# Patient Record
Sex: Female | Born: 1975
Health system: Southern US, Community
[De-identification: ages and names within clinical notes are randomized; demographics above are authoritative.]

## PROBLEM LIST (undated history)

## (undated) DIAGNOSIS — K219 Gastro-esophageal reflux disease without esophagitis: Secondary | ICD-10-CM

## (undated) DIAGNOSIS — T7840XA Allergy, unspecified, initial encounter: Secondary | ICD-10-CM

## (undated) DIAGNOSIS — F419 Anxiety disorder, unspecified: Secondary | ICD-10-CM

## (undated) HISTORY — DX: Gastro-esophageal reflux disease without esophagitis: K21.9

## (undated) HISTORY — DX: Allergy, unspecified, initial encounter: T78.40XA

## (undated) HISTORY — DX: Anxiety disorder, unspecified: F41.9

---

## 1998-05-08 ENCOUNTER — Emergency Department (HOSPITAL_COMMUNITY): Admission: EM | Admit: 1998-05-08 | Discharge: 1998-05-08 | Payer: Self-pay | Admitting: Endocrinology

## 1999-09-24 ENCOUNTER — Inpatient Hospital Stay (HOSPITAL_COMMUNITY): Admission: AD | Admit: 1999-09-24 | Discharge: 1999-09-24 | Payer: Self-pay | Admitting: *Deleted

## 1999-12-28 ENCOUNTER — Other Ambulatory Visit: Admission: RE | Admit: 1999-12-28 | Discharge: 1999-12-28 | Payer: Self-pay | Admitting: Internal Medicine

## 2001-01-09 ENCOUNTER — Emergency Department (HOSPITAL_COMMUNITY): Admission: EM | Admit: 2001-01-09 | Discharge: 2001-01-09 | Payer: Self-pay | Admitting: Emergency Medicine

## 2002-06-19 ENCOUNTER — Emergency Department (HOSPITAL_COMMUNITY): Admission: EM | Admit: 2002-06-19 | Discharge: 2002-06-19 | Payer: Self-pay | Admitting: Emergency Medicine

## 2002-06-20 ENCOUNTER — Encounter: Payer: Self-pay | Admitting: Emergency Medicine

## 2004-07-01 ENCOUNTER — Emergency Department (HOSPITAL_COMMUNITY): Admission: EM | Admit: 2004-07-01 | Discharge: 2004-07-01 | Payer: Self-pay | Admitting: Emergency Medicine

## 2005-01-30 ENCOUNTER — Emergency Department: Payer: Self-pay | Admitting: Unknown Physician Specialty

## 2005-01-31 ENCOUNTER — Emergency Department (HOSPITAL_COMMUNITY): Admission: EM | Admit: 2005-01-31 | Discharge: 2005-01-31 | Payer: Self-pay | Admitting: Emergency Medicine

## 2005-02-01 ENCOUNTER — Emergency Department: Payer: Self-pay | Admitting: Unknown Physician Specialty

## 2009-01-18 ENCOUNTER — Emergency Department (HOSPITAL_COMMUNITY): Admission: EM | Admit: 2009-01-18 | Discharge: 2009-01-19 | Payer: Self-pay | Admitting: Emergency Medicine

## 2011-02-25 ENCOUNTER — Emergency Department (HOSPITAL_COMMUNITY)
Admission: EM | Admit: 2011-02-25 | Discharge: 2011-02-26 | Disposition: A | Discharge status: Home / Self Care | Payer: BC Managed Care – PPO | Attending: Emergency Medicine | Admitting: Emergency Medicine

## 2011-02-25 DIAGNOSIS — K2990 Gastroduodenitis, unspecified, without bleeding: Secondary | ICD-10-CM | POA: Insufficient documentation

## 2011-02-25 DIAGNOSIS — IMO0001 Reserved for inherently not codable concepts without codable children: Secondary | ICD-10-CM | POA: Insufficient documentation

## 2011-02-25 DIAGNOSIS — R1013 Epigastric pain: Secondary | ICD-10-CM | POA: Insufficient documentation

## 2011-02-25 DIAGNOSIS — K2970 Gastritis, unspecified, without bleeding: Secondary | ICD-10-CM | POA: Insufficient documentation

## 2011-02-25 DIAGNOSIS — E876 Hypokalemia: Secondary | ICD-10-CM | POA: Insufficient documentation

## 2011-02-25 LAB — DIFFERENTIAL
Basophils Absolute: 0 10*3/uL (ref 0.0–0.1)
Basophils Relative: 0 % (ref 0–1)
Eosinophils Relative: 1 % (ref 0–5)
Lymphocytes Relative: 30 % (ref 12–46)
Lymphs Abs: 2.4 10*3/uL (ref 0.7–4.0)
Monocytes Absolute: 0.8 10*3/uL (ref 0.1–1.0)
Monocytes Relative: 9 % (ref 3–12)
Neutro Abs: 4.8 10*3/uL (ref 1.7–7.7)
Neutrophils Relative %: 60 % (ref 43–77)

## 2011-02-25 LAB — COMPREHENSIVE METABOLIC PANEL
ALT: 19 U/L (ref 0–35)
CO2: 25 mEq/L (ref 19–32)
Calcium: 9.2 mg/dL (ref 8.4–10.5)
Creatinine, Ser: 0.72 mg/dL (ref 0.4–1.2)
GFR calc non Af Amer: >60 mL/min (ref >60)
Glucose, Bld: 106 mg/dL — ABNORMAL HIGH (ref 70–99)
Sodium: 134 mEq/L — ABNORMAL LOW (ref 135–145)
Total Protein: 6.8 g/dL (ref 6.0–8.3)

## 2011-02-25 LAB — URINALYSIS, ROUTINE W REFLEX MICROSCOPIC
Leukocytes, UA: NEGATIVE
Nitrite: NEGATIVE
Specific Gravity, Urine: 1.03 (ref 1.005–1.030)
Urobilinogen, UA: 0.2 mg/dL (ref 0.0–1.0)
pH: 7.5 (ref 5.0–8.0)

## 2011-02-25 LAB — CBC
HCT: 44.6 % (ref 36.0–46.0)
MCH: 32.6 pg (ref 26.0–34.0)
MCHC: 34.1 g/dL (ref 30.0–36.0)
MCV: 95.7 fL (ref 78.0–100.0)
RBC: 4.66 MIL/uL (ref 3.87–5.11)
RDW: 13.6 % (ref 11.5–15.5)
WBC: 8 10*3/uL (ref 4.0–10.5)

## 2011-02-25 LAB — URINE MICROSCOPIC-ADD ON

## 2011-02-25 LAB — LIPASE, BLOOD: Lipase: 16 U/L (ref 11–59)

## 2012-09-07 LAB — HM PAP SMEAR

## 2013-06-30 ENCOUNTER — Ambulatory Visit (INDEPENDENT_AMBULATORY_CARE_PROVIDER_SITE_OTHER): Payer: BC Managed Care – PPO | Admitting: Physician Assistant

## 2013-06-30 VITALS — BP 118/74 | HR 78 | Temp 98.0°F | Resp 17 | Ht 65.0 in | Wt 192.0 lb

## 2013-06-30 DIAGNOSIS — J329 Chronic sinusitis, unspecified: Secondary | ICD-10-CM

## 2013-06-30 DIAGNOSIS — M545 Low back pain, unspecified: Secondary | ICD-10-CM

## 2013-06-30 DIAGNOSIS — R0982 Postnasal drip: Secondary | ICD-10-CM

## 2013-06-30 DIAGNOSIS — J029 Acute pharyngitis, unspecified: Secondary | ICD-10-CM

## 2013-06-30 DIAGNOSIS — R05 Cough: Secondary | ICD-10-CM

## 2013-06-30 DIAGNOSIS — R059 Cough, unspecified: Secondary | ICD-10-CM

## 2013-06-30 LAB — POCT RAPID STREP A (OFFICE): Rapid Strep A Screen: NEGATIVE

## 2013-06-30 MED ORDER — HYDROCODONE-HOMATROPINE 5-1.5 MG/5ML PO SYRP: 5.0000 mL | ORAL_SOLUTION | Freq: Three times a day (TID) | ORAL | Status: DC | PRN

## 2013-06-30 MED ORDER — CYCLOBENZAPRINE HCL 10 MG PO TABS: 10.0000 mg | ORAL_TABLET | Freq: Three times a day (TID) | ORAL | Status: DC | PRN

## 2013-06-30 MED ORDER — IPRATROPIUM BROMIDE 0.03 % NA SOLN: 2.0000 | Freq: Two times a day (BID) | NASAL | Status: DC

## 2013-06-30 MED ORDER — CETIRIZINE HCL 10 MG PO TABS: 10.0000 mg | ORAL_TABLET | Freq: Every day | ORAL | Status: DC

## 2013-06-30 MED ORDER — FIRST-DUKES MOUTHWASH MT SUSP: 10.0000 mL | OROMUCOSAL | Status: DC | PRN

## 2013-06-30 NOTE — Patient Instructions (Addendum)
Begin using the Atrovent (ipratropium) nasal spray 2-3 times per day to help with nasal congestion and postnasal drainage.  Zyrtec (cetirizine) once daily - this will help with congestion and drainage.  Ibuprofen 600mg  every 8 hours with food to help with throat pain and back pain.  Magic Mouthwash as frequently as every 2 hours if needed for sore throat.  Hycodan cough syrup at bedtime to help with cough - will likely make you sleepy  Flexeril (cyclobenzaprine) every 8 hours as needed for muscle spasm/back pain - may also make you sleepy  Stay hydrated - water is best!  Continue salt water gargles  STOP smoking!!  I will let you know when your throat culture is back and if we need to do anything differently.   Viral Pharyngitis Viral pharyngitis is a viral infection that produces redness, pain, and swelling (inflammation) of the throat. It can spread from person to person (contagious). CAUSES Viral pharyngitis is caused by inhaling a large amount of certain germs called viruses. Many different viruses cause viral pharyngitis. SYMPTOMS Symptoms of viral pharyngitis include:  Sore throat.  Tiredness.  Stuffy nose.  Low-grade fever.  Congestion.  Cough. TREATMENT Treatment includes rest, drinking plenty of fluids, and the use of over-the-counter medication (approved by your caregiver). HOME CARE INSTRUCTIONS   Drink enough fluids to keep your urine clear or pale yellow.  Eat soft, cold foods such as ice cream, frozen ice pops, or gelatin dessert.  Gargle with warm salt water (1 tsp salt per 1 qt of water).  If over age 1, throat lozenges may be used safely.  Only take over-the-counter or prescription medicines for pain, discomfort, or fever as directed by your caregiver. Do not take aspirin. To help prevent spreading viral pharyngitis to others, avoid:  Mouth-to-mouth contact with others.  Sharing utensils for eating and drinking.  Coughing around others. SEEK  MEDICAL CARE IF:   You are better in a few days, then become worse.  You have a fever or pain not helped by pain medicines.  There are any other changes that concern you. Document Released: 07/18/2005 Document Revised: 12/31/2011 Document Reviewed: 12/14/2010 [facility] Patient Information 2014 [place], [place].

## 2013-06-30 NOTE — Progress Notes (Signed)
Subjective:    Patient ID: TAILEY TOP, female    DOB: 19-Dec-1975, 36 y.o.   MRN: [id number]  HPI   Ms. Oliveri is a very pleasant 37 yr old female here with concern for illness. Reports worsening sore throat over the last couple days.  Has noted white stuff on tonsils.  Has previously been told she may need her tonsils out.  Endorses lots of post-nasal drainage and cough.  Minimal nasal drainage.  No fever or chills.  Cough is keeping her awake at night.  Abd muscles sore from coughing.  Pt does smoke two black and milds per day.  No known sick contacts.  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    Also complains of LBP after she slipped and fell a couple days ago.  Pain is bilateral.  No numbness, weakness, loss of bowel or bladder control. Has not taken anything for pain.  Has done ice and heat. Main complaint today is throat.    Did take a course of amox from dentist recently.  Finished last dose 1 wk ago   Review of Systems  Constitutional: Negative for fever and chills.  HENT: Positive for congestion, sore throat and postnasal drip. Negative for ear pain.   Respiratory: Positive for cough. Negative for shortness of breath and wheezing.   Cardiovascular: Negative.   Gastrointestinal: Negative.   Musculoskeletal: Negative.   Skin: Negative.   Neurological: Negative.        Objective:   Physical Exam  Vitals reviewed. Constitutional: She is oriented to person, place, and time. She appears well-developed and well-nourished. No distress.  HENT:  Head: Normocephalic and atraumatic.  Right Ear: Tympanic membrane and ear canal normal.  Left Ear: Tympanic membrane and ear canal normal.  Mouth/Throat: Uvula is midline and mucous membranes are normal. Oropharyngeal exudate, posterior oropharyngeal edema and posterior oropharyngeal erythema present. No tonsillar abscesses.  Eyes: Conjunctivae are normal. No scleral icterus.  Neck: Neck supple.  Cardiovascular: Normal rate, regular  rhythm and normal heart sounds.   Pulmonary/Chest: Effort normal and breath sounds normal. She has no wheezes. She has no rales.  Musculoskeletal:       Lumbar back: She exhibits tenderness and spasm. She exhibits normal range of motion and no bony tenderness.       Back:  Lymphadenopathy:    She has cervical adenopathy.  Neurological: She is alert and oriented to person, place, and time.  Skin: Skin is warm and dry.  Psychiatric: She has a normal mood and affect. Her behavior is normal.     Results for orders placed in visit on 06/30/13  POCT RAPID STREP A (OFFICE)      Result Value Range   Rapid Strep A Screen Negative  Negative        Assessment & Plan:  Acute pharyngitis - Plan: POCT rapid strep A, Culture, Group A Strep, Diphenhyd-Hydrocort-Nystatin (FIRST-DUKES MOUTHWASH) SUSP, Ambulatory referral to ENT, CANCELED: Wound culture  Low back pain - Plan: cyclobenzaprine (FLEXERIL) 10 MG tablet  Post-nasal drainage - Plan: ipratropium (ATROVENT) 0.03 % nasal spray, cetirizine (ZYRTEC) 10 MG tablet  Cough - Plan: HYDROcodone-homatropine (HYCODAN) 5-1.5 MG/5ML syrup    Ms. Crocker is a very pleasant 37 yr old female here with pharyngitis - likely viral, associated with URI symptoms.  Rapid strep is negative today.  Cx pending.  Will treat symptomatically for now - ibuprofen, Duke's, Atrovent nasal, Zyrtec.  Hycodan for cough at night.  Push fluids.  Rest.  Work  note provided.  Ibuprofen 600mg  q8h with food for throat pain and low back pain.  Flexeril q8h prn back pain/spasm.   RTC if any symptoms worsening or not improving.

## 2013-08-26 ENCOUNTER — Ambulatory Visit: Payer: BC Managed Care – PPO | Admitting: Family Medicine

## 2013-09-04 ENCOUNTER — Telehealth: Payer: Self-pay

## 2013-09-04 NOTE — Telephone Encounter (Signed)
Medication and allergies: reviewed and updated  90 day supply/mail order: na Local pharmacy: [facility] and [address]   Immunizations due:  Declines flu vaccine  A/P:   Updated  FH, PSH and medical hx  To Discuss with Provider: Not at this time

## 2013-09-07 ENCOUNTER — Ambulatory Visit (INDEPENDENT_AMBULATORY_CARE_PROVIDER_SITE_OTHER): Payer: BC Managed Care – PPO | Admitting: Family Medicine

## 2013-09-07 ENCOUNTER — Encounter: Payer: Self-pay | Admitting: Family Medicine

## 2013-09-07 VITALS — BP 108/74 | HR 94 | Temp 98.8°F | Ht 66.0 in | Wt 197.8 lb

## 2013-09-07 DIAGNOSIS — R0609 Other forms of dyspnea: Secondary | ICD-10-CM

## 2013-09-07 DIAGNOSIS — R0683 Snoring: Secondary | ICD-10-CM

## 2013-09-07 DIAGNOSIS — M549 Dorsalgia, unspecified: Secondary | ICD-10-CM

## 2013-09-07 DIAGNOSIS — E669 Obesity, unspecified: Secondary | ICD-10-CM

## 2013-09-07 DIAGNOSIS — G4700 Insomnia, unspecified: Secondary | ICD-10-CM

## 2013-09-07 DIAGNOSIS — Z0000 Encounter for general adult medical examination without abnormal findings: Secondary | ICD-10-CM

## 2013-09-07 DIAGNOSIS — R0989 Other specified symptoms and signs involving the circulatory and respiratory systems: Secondary | ICD-10-CM

## 2013-09-07 NOTE — Progress Notes (Signed)
Pre visit review using our clinic review tool, if applicable. No additional management support is needed unless otherwise documented below in the visit note. 

## 2013-09-07 NOTE — Progress Notes (Signed)
Subjective:     Yvette Brennan is a 37 y.o. female and is here for a comprehensive physical exam. The patient reports problems - back pain s/p fall in August --fell down wood steps.  History   Social History  . Marital Status: Single    Spouse Name: N/A    Number of Children: N/A  . Years of Education: N/A   Occupational History  . Not on file.   Social History Main Topics  . Smoking status: Current Every Day Smoker -- 0.00 packs/day for 10 years    Types: Cigars  . Smokeless tobacco: Not on file  . Alcohol Use: No  . Drug Use: No  . Sexual Activity: No   Other Topics Concern  . Not on file   Social History Narrative  . No narrative on file   Health Maintenance  Topic Date Due  . Influenza Vaccine  09/07/2014  . Pap Smear  09/08/2015  . Tetanus/tdap  10/22/2018    The following portions of the patient's history were reviewed and updated as appropriate:  She  has no past medical history on file. She  does not have a problem list on file. She  has no past surgical history on file. Her family history includes Diabetes in her father and paternal grandmother; Heart disease in her maternal grandfather, maternal grandmother, and paternal grandmother. She  reports that she has been smoking Cigars.  She does not have any smokeless tobacco history on file. She reports that she does not drink alcohol or use illicit drugs. She has a current medication list which includes the following prescription(s): cyclobenzaprine. Current Outpatient Prescriptions on File Prior to Visit  Medication Sig Dispense Refill  . cyclobenzaprine (FLEXERIL) 10 MG tablet Take 1 tablet (10 mg total) by mouth 3 (three) times daily as needed for muscle spasms.  30 tablet  0   No current facility-administered medications on file prior to visit.   She has No Known Allergies..  Review of Systems Review of Systems  Constitutional: Negative for activity change, appetite change and fatigue.  HENT:  Negative for hearing loss, congestion, tinnitus and ear discharge.  dentist q63m Eyes: Negative for visual disturbance (see optho q1y -- vision corrected to 20/20 with glasses).  Respiratory: Negative for cough, chest tightness and shortness of breath.   Cardiovascular: Negative for chest pain, palpitations and leg swelling.  Gastrointestinal: Negative for abdominal pain, diarrhea, constipation and abdominal distention.  Genitourinary: Negative for urgency, frequency, decreased urine volume and difficulty urinating.  Musculoskeletal: + back pain Skin: Negative for color change, pallor and rash.  Neurological: Negative for dizziness, light-headedness, numbness and headaches.  Hematological: Negative for adenopathy. Does not bruise/bleed easily.  Psychiatric/Behavioral: Negative for suicidal ideas, confusion, sleep disturbance, self-injury, dysphoric mood, decreased concentration and agitation.       Objective:    BP 108/74  Pulse 94  Temp(Src) 98.8 F (37.1 C) (Oral)  Ht 5\' 6"  (1.676 m)  Wt 197 lb 12.8 oz (89.721 kg)  BMI 31.94 kg/m2  SpO2 97%  LMP 08/26/2013 General appearance: alert, cooperative, appears stated age and no distress Head: Normocephalic, without obvious abnormality, atraumatic Eyes: conjunctivae/corneas clear. PERRL, EOM's intact. Fundi benign. Ears: normal TM's and external ear canals both ears Nose: Nares normal. Septum midline. Mucosa normal. No drainage or sinus tenderness. Throat: lips, mucosa, and tongue normal; teeth and gums normal Neck: no adenopathy, no carotid bruit, no JVD, supple, symmetrical, trachea midline and thyroid not enlarged, symmetric, no tenderness/mass/nodules Back: symmetric,  no curvature. ROM normal. No CVA tenderness. Lungs: clear to auscultation bilaterally Breasts: normal appearance, no masses or tenderness Heart: regular rate and rhythm, S1, S2 normal, no murmur, click, rub or gallop Abdomen: soft, non-tender; bowel sounds normal; no  masses,  no organomegaly Pelvic: deferred Extremities: extremities normal, atraumatic, no cyanosis or edema Pulses: 2+ and symmetric Skin: Skin color, texture, turgor normal. No rashes or lesions Lymph nodes: Cervical, supraclavicular, and axillary nodes normal. Neurologic: Alert and oriented X 3, normal strength and tone. Normal symmetric reflexes. Normal coordination and gait Psych--no depression, no anxiety      Assessment:    Healthy female exam.      Plan:    ghm utd Check labs See After Visit Summary for Counseling Recommendations

## 2013-09-07 NOTE — Patient Instructions (Signed)
Preventive Care for Adults, Female A healthy lifestyle and preventive care can promote health and wellness. Preventive health guidelines for women include the following key practices.  A routine yearly physical is a good way to check with your caregiver about your health and preventive screening. It is a chance to share any concerns and updates on your health, and to receive a thorough exam.  Visit your dentist for a routine exam and preventive care every 6 months. Brush your teeth twice a day and floss once a day. Good oral hygiene prevents tooth decay and gum disease.  The frequency of eye exams is based on your age, health, family medical history, use of contact lenses, and other factors. Follow your caregiver's recommendations for frequency of eye exams.  Eat a healthy diet. Foods like vegetables, fruits, whole grains, low-fat dairy products, and lean protein foods contain the nutrients you need without too many calories. Decrease your intake of foods high in solid fats, added sugars, and salt. Eat the right amount of calories for you.Get information about a proper diet from your caregiver, if necessary.  Regular physical exercise is one of the most important things you can do for your health. Most adults should get at least 150 minutes of moderate-intensity exercise (any activity that increases your heart rate and causes you to sweat) each week. In addition, most adults need muscle-strengthening exercises on 2 or more days a week.  Maintain a healthy weight. The body mass index (BMI) is a screening tool to identify possible weight problems. It provides an estimate of body fat based on height and weight. Your caregiver can help determine your BMI, and can help you achieve or maintain a healthy weight.For adults 20 years and older:  A BMI below 18.5 is considered underweight.  A BMI of 18.5 to 24.9 is normal.  A BMI of 25 to 29.9 is considered overweight.  A BMI of 30 and above is  considered obese.  Maintain normal blood lipids and cholesterol levels by exercising and minimizing your intake of saturated fat. Eat a balanced diet with plenty of fruit and vegetables. Blood tests for lipids and cholesterol should begin at age 20 and be repeated every 5 years. If your lipid or cholesterol levels are high, you are over 50, or you are at high risk for heart disease, you may need your cholesterol levels checked more frequently.Ongoing high lipid and cholesterol levels should be treated with medicines if diet and exercise are not effective.  If you smoke, find out from your caregiver how to quit. If you do not use tobacco, do not start.  Lung cancer screening is recommended for adults aged 55 80 years who are at high risk for developing lung cancer because of a history of smoking. Yearly low-dose computed tomography (CT) is recommended for people who have at least a 30-pack-year history of smoking and are a current smoker or have quit within the past 15 years. A pack year of smoking is smoking an average of 1 pack of cigarettes a day for 1 year (for example: 1 pack a day for 30 years or 2 packs a day for 15 years). Yearly screening should continue until the smoker has stopped smoking for at least 15 years. Yearly screening should also be stopped for people who develop a health problem that would prevent them from having lung cancer treatment.  If you are pregnant, do not drink alcohol. If you are breastfeeding, be very cautious about drinking alcohol. If you are   not pregnant and choose to drink alcohol, do not exceed 1 drink per day. One drink is considered to be 12 ounces (355 mL) of beer, 5 ounces (148 mL) of wine, or 1.5 ounces (44 mL) of liquor.  Avoid use of street drugs. Do not share needles with anyone. Ask for help if you need support or instructions about stopping the use of drugs.  High blood pressure causes heart disease and increases the risk of stroke. Your blood pressure  should be checked at least every 1 to 2 years. Ongoing high blood pressure should be treated with medicines if weight loss and exercise are not effective.  If you are 55 to 39 years old, ask your caregiver if you should take aspirin to prevent strokes.  Diabetes screening involves taking a blood sample to check your fasting blood sugar level. This should be done once every 3 years, after age 45, if you are within normal weight and without risk factors for diabetes. Testing should be considered at a younger age or be carried out more frequently if you are overweight and have at least 1 risk factor for diabetes.  Breast cancer screening is essential preventive care for women. You should practice "breast self-awareness." This means understanding the normal appearance and feel of your breasts and may include breast self-examination. Any changes detected, no matter how small, should be reported to a caregiver. Women in their 20s and 30s should have a clinical breast exam (CBE) by a caregiver as part of a regular health exam every 1 to 3 years. After age 40, women should have a CBE every year. Starting at age 40, women should consider having a mammography (breast X-ray test) every year. Women who have a family history of breast cancer should talk to their caregiver about genetic screening. Women at a high risk of breast cancer should talk to their caregivers about having magnetic resonance imaging (MRI) and a mammography every year.  Breast cancer gene (BRCA)-related cancer risk assessment is recommended for women who have family members with BRCA-related cancers. BRCA-related cancers include breast, ovarian, tubal, and peritoneal cancers. Having family members with these cancers may be associated with an increased risk for harmful changes (mutations) in the breast cancer genes BRCA1 and BRCA2. Results of the assessment will determine the need for genetic counseling and BRCA1 and BRCA2 testing.  The Pap test is  a screening test for cervical cancer. A Pap test can show cell changes on the cervix that might become cervical cancer if left untreated. A Pap test is a procedure in which cells are obtained and examined from the lower end of the uterus (cervix).  Women should have a Pap test starting at age 21.  Between ages 21 and 29, Pap tests should be repeated every 2 years.  Beginning at age 30, you should have a Pap test every 3 years as long as the past 3 Pap tests have been normal.  Some women have medical problems that increase the chance of getting cervical cancer. Talk to your caregiver about these problems. It is especially important to talk to your caregiver if a new problem develops soon after your last Pap test. In these cases, your caregiver may recommend more frequent screening and Pap tests.  The above recommendations are the same for women who have or have not gotten the vaccine for human papillomavirus (HPV).  If you had a hysterectomy for a problem that was not cancer or a condition that could lead to cancer, then   you no longer need Pap tests. Even if you no longer need a Pap test, a regular exam is a good idea to make sure no other problems are starting.  If you are between ages 65 and 70, and you have had normal Pap tests going back 10 years, you no longer need Pap tests. Even if you no longer need a Pap test, a regular exam is a good idea to make sure no other problems are starting.  If you have had past treatment for cervical cancer or a condition that could lead to cancer, you need Pap tests and screening for cancer for at least 20 years after your treatment.  If Pap tests have been discontinued, risk factors (such as a new sexual partner) need to be reassessed to determine if screening should be resumed.  The HPV test is an additional test that may be used for cervical cancer screening. The HPV test looks for the virus that can cause the cell changes on the cervix. The cells collected  during the Pap test can be tested for HPV. The HPV test could be used to screen women aged 30 years and older, and should be used in women of any age who have unclear Pap test results. After the age of 30, women should have HPV testing at the same frequency as a Pap test.  Colorectal cancer can be detected and often prevented. Most routine colorectal cancer screening begins at the age of 50 and continues through age 75. However, your caregiver may recommend screening at an earlier age if you have risk factors for colon cancer. On a yearly basis, your caregiver may provide home test kits to check for hidden blood in the stool. Use of a small camera at the end of a tube, to directly examine the colon (sigmoidoscopy or colonoscopy), can detect the earliest forms of colorectal cancer. Talk to your caregiver about this at age 50, when routine screening begins. Direct examination of the colon should be repeated every 5 to 10 years through age 75, unless early forms of pre-cancerous polyps or small growths are found.  Hepatitis C blood testing is recommended for all people born from 1945 through 1965 and any individual with known risks for hepatitis C.  Practice safe sex. Use condoms and avoid high-risk sexual practices to reduce the spread of sexually transmitted infections (STIs). STIs include gonorrhea, chlamydia, syphilis, trichomonas, herpes, HPV, and human immunodeficiency virus (HIV). Herpes, HIV, and HPV are viral illnesses that have no cure. They can result in disability, cancer, and death. Sexually active women aged 25 and younger should be checked for chlamydia. Older women with new or multiple partners should also be tested for chlamydia. Testing for other STIs is recommended if you are sexually active and at increased risk.  Osteoporosis is a disease in which the bones lose minerals and strength with aging. This can result in serious bone fractures. The risk of osteoporosis can be identified using a  bone density scan. Women ages 65 and over and women at risk for fractures or osteoporosis should discuss screening with their caregivers. Ask your caregiver whether you should take a calcium supplement or vitamin D to reduce the rate of osteoporosis.  Menopause can be associated with physical symptoms and risks. Hormone replacement therapy is available to decrease symptoms and risks. You should talk to your caregiver about whether hormone replacement therapy is right for you.  Use sunscreen. Apply sunscreen liberally and repeatedly throughout the day. You should seek shade   when your shadow is shorter than you. Protect yourself by wearing long sleeves, pants, a wide-brimmed hat, and sunglasses year round, whenever you are outdoors.  Once a month, do a whole body skin exam, using a mirror to look at the skin on your back. Notify your caregiver of new moles, moles that have irregular borders, moles that are larger than a pencil eraser, or moles that have changed in shape or color.  Stay current with required immunizations.  Influenza vaccine. All adults should be immunized every year.  Tetanus, diphtheria, and acellular pertussis (Td, Tdap) vaccine. Pregnant women should receive 1 dose of Tdap vaccine during each pregnancy. The dose should be obtained regardless of the length of time since the last dose. Immunization is preferred during the 27th to 36th week of gestation. An adult who has not previously received Tdap or who does not know her vaccine status should receive 1 dose of Tdap. This initial dose should be followed by tetanus and diphtheria toxoids (Td) booster doses every 10 years. Adults with an unknown or incomplete history of completing a 3-dose immunization series with Td-containing vaccines should begin or complete a primary immunization series including a Tdap dose. Adults should receive a Td booster every 10 years.  Varicella vaccine. An adult without evidence of immunity to varicella  should receive 2 doses or a second dose if she has previously received 1 dose. Pregnant females who do not have evidence of immunity should receive the first dose after pregnancy. This first dose should be obtained before leaving the health care facility. The second dose should be obtained 4 8 weeks after the first dose.  Human papillomavirus (HPV) vaccine. Females aged 13 26 years who have not received the vaccine previously should obtain the 3-dose series. The vaccine is not recommended for use in pregnant females. However, pregnancy testing is not needed before receiving a dose. If a female is found to be pregnant after receiving a dose, no treatment is needed. In that case, the remaining doses should be delayed until after the pregnancy. Immunization is recommended for any person with an immunocompromised condition through the age of 26 years if she did not get any or all doses earlier. During the 3-dose series, the second dose should be obtained 4 8 weeks after the first dose. The third dose should be obtained 24 weeks after the first dose and 16 weeks after the second dose.  Zoster vaccine. One dose is recommended for adults aged 60 years or older unless certain conditions are present.  Measles, mumps, and rubella (MMR) vaccine. Adults born before 1957 generally are considered immune to measles and mumps. Adults born in 1957 or later should have 1 or more doses of MMR vaccine unless there is a contraindication to the vaccine or there is laboratory evidence of immunity to each of the three diseases. A routine second dose of MMR vaccine should be obtained at least 28 days after the first dose for students attending postsecondary schools, health care workers, or international travelers. People who received inactivated measles vaccine or an unknown type of measles vaccine during 1963 1967 should receive 2 doses of MMR vaccine. People who received inactivated mumps vaccine or an unknown type of mumps vaccine  before 1979 and are at high risk for mumps infection should consider immunization with 2 doses of MMR vaccine. For females of childbearing age, rubella immunity should be determined. If there is no evidence of immunity, females who are not pregnant should be vaccinated. If there   is no evidence of immunity, females who are pregnant should delay immunization until after pregnancy. Unvaccinated health care workers born before 1957 who lack laboratory evidence of measles, mumps, or rubella immunity or laboratory confirmation of disease should consider measles and mumps immunization with 2 doses of MMR vaccine or rubella immunization with 1 dose of MMR vaccine.  Pneumococcal 13-valent conjugate (PCV13) vaccine. When indicated, a person who is uncertain of her immunization history and has no record of immunization should receive the PCV13 vaccine. An adult aged 19 years or older who has certain medical conditions and has not been previously immunized should receive 1 dose of PCV13 vaccine. This PCV13 should be followed with a dose of pneumococcal polysaccharide (PPSV23) vaccine. The PPSV23 vaccine dose should be obtained at least 8 weeks after the dose of PCV13 vaccine. An adult aged 19 years or older who has certain medical conditions and previously received 1 or more doses of PPSV23 vaccine should receive 1 dose of PCV13. The PCV13 vaccine dose should be obtained 1 or more years after the last PPSV23 vaccine dose.  Pneumococcal polysaccharide (PPSV23) vaccine. When PCV13 is also indicated, PCV13 should be obtained first. All adults aged 65 years and older should be immunized. An adult younger than age 65 years who has certain medical conditions should be immunized. Any person who resides in a nursing home or long-term care facility should be immunized. An adult smoker should be immunized. People with an immunocompromised condition and certain other conditions should receive both PCV13 and PPSV23 vaccines. People  with human immunodeficiency virus (HIV) infection should be immunized as soon as possible after diagnosis. Immunization during chemotherapy or radiation therapy should be avoided. Routine use of PPSV23 vaccine is not recommended for American Indians, [place] Natives, or people younger than 65 years unless there are medical conditions that require PPSV23 vaccine. When indicated, people who have unknown immunization and have no record of immunization should receive PPSV23 vaccine. One-time revaccination 5 years after the first dose of PPSV23 is recommended for people aged 19 64 years who have chronic kidney failure, nephrotic syndrome, asplenia, or immunocompromised conditions. People who received 1 2 doses of PPSV23 before age 65 years should receive another dose of PPSV23 vaccine at age 65 years or later if at least 5 years have passed since the previous dose. Doses of PPSV23 are not needed for people immunized with PPSV23 at or after age 65 years.  Meningococcal vaccine. Adults with asplenia or persistent complement component deficiencies should receive 2 doses of quadrivalent meningococcal conjugate (MenACWY-D) vaccine. The doses should be obtained at least 2 months apart. Microbiologists working with certain meningococcal bacteria, military recruits, people at risk during an outbreak, and people who travel to or live in countries with a high rate of meningitis should be immunized. A first-year college student up through age 21 years who is living in a residence hall should receive a dose if she did not receive a dose on or after her 16th birthday. Adults who have certain high-risk conditions should receive one or more doses of vaccine.  Hepatitis A vaccine. Adults who wish to be protected from this disease, have certain high-risk conditions, work with hepatitis A-infected animals, work in hepatitis A research labs, or travel to or work in countries with a high rate of hepatitis A should be immunized. Adults  who were previously unvaccinated and who anticipate close contact with an international adoptee during the first 60 days after arrival in the [place] from a country   with a high rate of hepatitis A should be immunized.  Hepatitis B vaccine. Adults who wish to be protected from this disease, have certain high-risk conditions, may be exposed to blood or other infectious body fluids, are household contacts or sex partners of hepatitis B positive people, are clients or workers in certain care facilities, or travel to or work in countries with a high rate of hepatitis B should be immunized.  Haemophilus influenzae type b (Hib) vaccine. A previously unvaccinated person with asplenia or sickle cell disease or having a scheduled splenectomy should receive 1 dose of Hib vaccine. Regardless of previous immunization, a recipient of a hematopoietic stem cell transplant should receive a 3-dose series 6 12 months after her successful transplant. Hib vaccine is not recommended for adults with HIV infection. Preventive Services / Frequency Ages 19 to 39  Blood pressure check.** / Every 1 to 2 years.  Lipid and cholesterol check.** / Every 5 years beginning at age 20.  Clinical breast exam.** / Every 3 years for women in their 20s and 30s.  BRCA-related cancer risk assessment.** / For women who have family members with a BRCA-related cancer (breast, ovarian, tubal, or peritoneal cancers).  Pap test.** / Every 2 years from ages 21 through 29. Every 3 years starting at age 30 through age 65 or 70 with a history of 3 consecutive normal Pap tests.  HPV screening.** / Every 3 years from ages 30 through ages 65 to 70 with a history of 3 consecutive normal Pap tests.  Hepatitis C blood test.** / For any individual with known risks for hepatitis C.  Skin self-exam. / Monthly.  Influenza vaccine. / Every year.  Tetanus, diphtheria, and acellular pertussis (Tdap, Td) vaccine.** / Consult your caregiver. Pregnant  women should receive 1 dose of Tdap vaccine during each pregnancy. 1 dose of Td every 10 years.  Varicella vaccine.** / Consult your caregiver. Pregnant females who do not have evidence of immunity should receive the first dose after pregnancy.  HPV vaccine. / 3 doses over 6 months, if 26 and younger. The vaccine is not recommended for use in pregnant females. However, pregnancy testing is not needed before receiving a dose.  Measles, mumps, rubella (MMR) vaccine.** / You need at least 1 dose of MMR if you were born in 1957 or later. You may also need a 2nd dose. For females of childbearing age, rubella immunity should be determined. If there is no evidence of immunity, females who are not pregnant should be vaccinated. If there is no evidence of immunity, females who are pregnant should delay immunization until after pregnancy.  Pneumococcal 13-valent conjugate (PCV13) vaccine.** / Consult your caregiver.  Pneumococcal polysaccharide (PPSV23) vaccine.** / 1 to 2 doses if you smoke cigarettes or if you have certain conditions.  Meningococcal vaccine.** / 1 dose if you are age 19 to 21 years and a first-year college student living in a residence hall, or have one of several medical conditions, you need to get vaccinated against meningococcal disease. You may also need additional booster doses.  Hepatitis A vaccine.** / Consult your caregiver.  Hepatitis B vaccine.** / Consult your caregiver.  Haemophilus influenzae type b (Hib) vaccine.** / Consult your caregiver. Ages 40 to 64  Blood pressure check.** / Every 1 to 2 years.  Lipid and cholesterol check.** / Every 5 years beginning at age 20.  Lung cancer screening. / Every year if you are aged 55 80 years and have a 30-pack-year history of smoking and   currently smoke or have quit within the past 15 years. Yearly screening is stopped once you have quit smoking for at least 15 years or develop a health problem that would prevent you from having  lung cancer treatment.  Clinical breast exam.** / Every year after age 40.  BRCA-related cancer risk assessment.** / For women who have family members with a BRCA-related cancer (breast, ovarian, tubal, or peritoneal cancers).  Mammogram.** / Every year beginning at age 40 and continuing for as long as you are in good health. Consult with your caregiver.  Pap test.** / Every 3 years starting at age 30 through age 65 or 70 with a history of 3 consecutive normal Pap tests.  HPV screening.** / Every 3 years from ages 30 through ages 65 to 70 with a history of 3 consecutive normal Pap tests.  Fecal occult blood test (FOBT) of stool. / Every year beginning at age 50 and continuing until age 75. You may not need to do this test if you get a colonoscopy every 10 years.  Flexible sigmoidoscopy or colonoscopy.** / Every 5 years for a flexible sigmoidoscopy or every 10 years for a colonoscopy beginning at age 50 and continuing until age 75.  Hepatitis C blood test.** / For all people born from 1945 through 1965 and any individual with known risks for hepatitis C.  Skin self-exam. / Monthly.  Influenza vaccine. / Every year.  Tetanus, diphtheria, and acellular pertussis (Tdap/Td) vaccine.** / Consult your caregiver. Pregnant women should receive 1 dose of Tdap vaccine during each pregnancy. 1 dose of Td every 10 years.  Varicella vaccine.** / Consult your caregiver. Pregnant females who do not have evidence of immunity should receive the first dose after pregnancy.  Zoster vaccine.** / 1 dose for adults aged 60 years or older.  Measles, mumps, rubella (MMR) vaccine.** / You need at least 1 dose of MMR if you were born in 1957 or later. You may also need a 2nd dose. For females of childbearing age, rubella immunity should be determined. If there is no evidence of immunity, females who are not pregnant should be vaccinated. If there is no evidence of immunity, females who are pregnant should delay  immunization until after pregnancy.  Pneumococcal 13-valent conjugate (PCV13) vaccine.** / Consult your caregiver.  Pneumococcal polysaccharide (PPSV23) vaccine.** / 1 to 2 doses if you smoke cigarettes or if you have certain conditions.  Meningococcal vaccine.** / Consult your caregiver.  Hepatitis A vaccine.** / Consult your caregiver.  Hepatitis B vaccine.** / Consult your caregiver.  Haemophilus influenzae type b (Hib) vaccine.** / Consult your caregiver. Ages 65 and over  Blood pressure check.** / Every 1 to 2 years.  Lipid and cholesterol check.** / Every 5 years beginning at age 20.  Lung cancer screening. / Every year if you are aged 55 80 years and have a 30-pack-year history of smoking and currently smoke or have quit within the past 15 years. Yearly screening is stopped once you have quit smoking for at least 15 years or develop a health problem that would prevent you from having lung cancer treatment.  Clinical breast exam.** / Every year after age 40.  BRCA-related cancer risk assessment.** / For women who have family members with a BRCA-related cancer (breast, ovarian, tubal, or peritoneal cancers).  Mammogram.** / Every year beginning at age 40 and continuing for as long as you are in good health. Consult with your caregiver.  Pap test.** / Every 3 years starting at age   30 through age 65 or 70 with a 3 consecutive normal Pap tests. Testing can be stopped between 65 and 70 with 3 consecutive normal Pap tests and no abnormal Pap or HPV tests in the past 10 years.  HPV screening.** / Every 3 years from ages 30 through ages 65 or 70 with a history of 3 consecutive normal Pap tests. Testing can be stopped between 65 and 70 with 3 consecutive normal Pap tests and no abnormal Pap or HPV tests in the past 10 years.  Fecal occult blood test (FOBT) of stool. / Every year beginning at age 50 and continuing until age 75. You may not need to do this test if you get a colonoscopy  every 10 years.  Flexible sigmoidoscopy or colonoscopy.** / Every 5 years for a flexible sigmoidoscopy or every 10 years for a colonoscopy beginning at age 50 and continuing until age 75.  Hepatitis C blood test.** / For all people born from 1945 through 1965 and any individual with known risks for hepatitis C.  Osteoporosis screening.** / A one-time screening for women ages 65 and over and women at risk for fractures or osteoporosis.  Skin self-exam. / Monthly.  Influenza vaccine. / Every year.  Tetanus, diphtheria, and acellular pertussis (Tdap/Td) vaccine.** / 1 dose of Td every 10 years.  Varicella vaccine.** / Consult your caregiver.  Zoster vaccine.** / 1 dose for adults aged 60 years or older.  Pneumococcal 13-valent conjugate (PCV13) vaccine.** / Consult your caregiver.  Pneumococcal polysaccharide (PPSV23) vaccine.** / 1 dose for all adults aged 65 years and older.  Meningococcal vaccine.** / Consult your caregiver.  Hepatitis A vaccine.** / Consult your caregiver.  Hepatitis B vaccine.** / Consult your caregiver.  Haemophilus influenzae type b (Hib) vaccine.** / Consult your caregiver. ** Family history and personal history of risk and conditions may change your caregiver's recommendations. Document Released: 12/04/2001 Document Revised: 02/02/2013 Document Reviewed: 03/05/2011 Exit[employer] Patient Information 2014 [employer].  

## 2013-09-09 DIAGNOSIS — G4700 Insomnia, unspecified: Secondary | ICD-10-CM | POA: Insufficient documentation

## 2013-09-09 NOTE — Assessment & Plan Note (Signed)
Pt working on diet and exercise 

## 2013-09-09 NOTE — Assessment & Plan Note (Signed)
Refer to pulm for sleep eval + snoring

## 2013-09-16 ENCOUNTER — Other Ambulatory Visit (INDEPENDENT_AMBULATORY_CARE_PROVIDER_SITE_OTHER): Payer: BC Managed Care – PPO

## 2013-09-16 DIAGNOSIS — Z0000 Encounter for general adult medical examination without abnormal findings: Secondary | ICD-10-CM

## 2013-09-16 LAB — CBC WITH DIFFERENTIAL/PLATELET
Basophils Relative: 0.7 % (ref 0.0–3.0)
Eosinophils Absolute: 0.1 10*3/uL (ref 0.0–0.7)
HCT: 41.1 % (ref 36.0–46.0)
Hemoglobin: 13.8 g/dL (ref 12.0–15.0)
Lymphocytes Relative: 34.7 % (ref 12.0–46.0)
Lymphs Abs: 2.3 10*3/uL (ref 0.7–4.0)
MCHC: 33.5 g/dL (ref 30.0–36.0)
Neutro Abs: 3.4 10*3/uL (ref 1.4–7.7)
RBC: 4.24 Mil/uL (ref 3.87–5.11)

## 2013-09-16 LAB — LIPID PANEL: Cholesterol: 168 mg/dL (ref 0–200)

## 2013-09-16 LAB — BASIC METABOLIC PANEL
CO2: 24 mEq/L (ref 19–32)
Calcium: 8.4 mg/dL (ref 8.4–10.5)
Chloride: 106 mEq/L (ref 96–112)
Glucose, Bld: 83 mg/dL (ref 70–99)
Potassium: 3.2 mEq/L — ABNORMAL LOW (ref 3.5–5.1)
Sodium: 136 mEq/L (ref 135–145)

## 2013-09-16 LAB — HEPATIC FUNCTION PANEL
AST: 16 U/L (ref 0–37)
Albumin: 3.3 g/dL — ABNORMAL LOW (ref 3.5–5.2)
Alkaline Phosphatase: 63 U/L (ref 39–117)
Total Protein: 6.6 g/dL (ref 6.0–8.3)

## 2013-09-16 LAB — TSH: TSH: 0.9 u[IU]/mL (ref 0.35–5.50)

## 2013-09-22 MED ORDER — POTASSIUM CHLORIDE CRYS ER 20 MEQ PO TBCR: 20.0000 meq | EXTENDED_RELEASE_TABLET | Freq: Once | ORAL | Status: DC

## 2013-09-30 ENCOUNTER — Telehealth: Payer: Self-pay | Admitting: Family Medicine

## 2013-09-30 NOTE — Telephone Encounter (Signed)
Its not a common side effect listed---- only way to know for sure is to stop it for a few days and see if it improves--- inc K rich foods in diet

## 2013-09-30 NOTE — Telephone Encounter (Signed)
MSG left to call the office      KP 

## 2013-09-30 NOTE — Telephone Encounter (Signed)
Spoke with patient and voiced understanding, I gave her high K foods and also mailed her a list.        KP

## 2013-09-30 NOTE — Telephone Encounter (Signed)
Please advise      KP 

## 2013-09-30 NOTE — Telephone Encounter (Signed)
Patient is wanting to know if her potassium chloride SA  rx causes skin irritation. She states that since she has started taking it she finds that her face has been very dry and itchy. Please advise.

## 2013-10-12 ENCOUNTER — Institutional Professional Consult (permissible substitution): Payer: BC Managed Care – PPO | Admitting: Pulmonary Disease

## 2013-10-23 ENCOUNTER — Other Ambulatory Visit (INDEPENDENT_AMBULATORY_CARE_PROVIDER_SITE_OTHER): Payer: BC Managed Care – PPO

## 2013-10-23 DIAGNOSIS — E874 Mixed disorder of acid-base balance: Secondary | ICD-10-CM

## 2013-10-23 LAB — BASIC METABOLIC PANEL
BUN: 8 mg/dL (ref 6–23)
CALCIUM: 8.5 mg/dL (ref 8.4–10.5)
CO2: 23 mEq/L (ref 19–32)
CREATININE: 0.7 mg/dL (ref 0.4–1.2)
Chloride: 108 mEq/L (ref 96–112)
GFR: 115.78 mL/min (ref >60.00)
Glucose, Bld: 100 mg/dL — ABNORMAL HIGH (ref 70–99)
POTASSIUM: 3.8 meq/L (ref 3.5–5.1)
Sodium: 137 mEq/L (ref 135–145)

## 2013-11-12 ENCOUNTER — Ambulatory Visit: Payer: BC Managed Care – PPO

## 2013-11-12 ENCOUNTER — Ambulatory Visit (INDEPENDENT_AMBULATORY_CARE_PROVIDER_SITE_OTHER): Payer: BC Managed Care – PPO | Admitting: Emergency Medicine

## 2013-11-12 VITALS — BP 98/60 | HR 72 | Temp 99.1°F | Resp 16 | Ht 65.0 in | Wt 202.0 lb

## 2013-11-12 DIAGNOSIS — M2390 Unspecified internal derangement of unspecified knee: Secondary | ICD-10-CM

## 2013-11-12 DIAGNOSIS — M25569 Pain in unspecified knee: Secondary | ICD-10-CM

## 2013-11-12 MED ORDER — NAPROXEN SODIUM 550 MG PO TABS: 550.0000 mg | ORAL_TABLET | Freq: Two times a day (BID) | ORAL | Status: AC

## 2013-11-12 NOTE — Patient Instructions (Signed)
Knee Sprain  A knee sprain is a tear in one of the strong, fibrous tissues that connect the bones (ligaments) in your knee. The severity of the sprain depends on how much of the ligament is torn. The tear can be either partial or complete.  CAUSES   Often, sprains are a result of a fall or injury. The force of the impact causes the fibers of your ligament to stretch too much. This excess tension causes the fibers of your ligament to tear.  SIGNS AND SYMPTOMS   You may have some loss of motion in your knee. Other symptoms include:   Bruising.   Pain in the knee area.   Tenderness of the knee to the touch.   Swelling.  DIAGNOSIS   To diagnose a knee sprain, your health care provider will physically examine your knee. Your health care provider may also suggest an X-ray exam of your knee to make sure no bones are broken.  TREATMENT   If your ligament is only partially torn, treatment usually involves keeping the knee in a fixed position (immobilization) or bracing your knee for activities that require movement for several weeks. To do this, your health care provider will apply a bandage, cast, or splint to keep your knee from moving and to support your knee during movement until it heals. For a partially torn ligament, the healing process usually takes 4 6 weeks.  If your ligament is completely torn, depending on which ligament it is, you may need surgery to reconnect the ligament to the bone or reconstruct it. After surgery, a cast or splint may be applied and will need to stay on your knee for 4 6 weeks while your ligament heals.  HOME CARE INSTRUCTIONS   Keep your injured knee elevated to decrease swelling.   To ease pain and swelling, apply ice to the injured area:   Put ice in a plastic bag.   Place a towel between your skin and the bag.   Leave the ice on for 20 minutes, 2 3 times a day.   Only take medicine for pain as directed by your health care provider.   Do not leave your knee unprotected until  pain and stiffness go away (usually 4 6 weeks).   If you have a cast or splint, do not allow it to get wet. If you have been instructed not to remove it, cover it with a plastic bag when you shower or bathe. Do not swim.   Your health care provider may suggest exercises for you to do during your recovery to prevent or limit permanent weakness and stiffness.  SEEK IMMEDIATE MEDICAL CARE IF:   Your cast or splint becomes damaged.   Your pain becomes worse.   You have significant pain, swelling, or numbness below the cast or splint.  MAKE SURE YOU:   Understand these instructions.   Will watch your condition.   Will get help right away if you are not doing well or get worse.  Document Released: 10/08/2005 Document Revised: 07/29/2013 Document Reviewed: 05/20/2013  [employer] Patient Information 2014 [employer].

## 2013-11-12 NOTE — Progress Notes (Signed)
[facility] [address], [place] [id number] [phone]  Date:  11/12/2013   Name:  Yvette Brennan   DOB:  1976-08-20   MRN:  [id number]  PCP:  Loreen FreudYvonne Lowne, DO    Chief Complaint: Knee Injury   History of Present Illness:  Yvette Brennan is a 38 y.o. very pleasant female patient who presents with the following:  Injured prior to Christmas when she slipped and fell.  Landed on knee and has persistent pain in the knee.  Unable to flex past 90 degrees.  Unable to walk without pain.  No improvement with over the counter medications or other home remedies. Denies other complaint or health concern today.   Patient Active Problem List   Diagnosis Date Noted  . Insomnia 09/09/2013  . Obesity (BMI 30-39.9) 09/07/2013    No past medical history on file.  No past surgical history on file.  History  Substance Use Topics  . Smoking status: Current Every Day Smoker -- 0.10 packs/day for 10 years    Types: Cigars  . Smokeless tobacco: Not on file  . Alcohol Use: No    Family History  Problem Relation Age of Onset  . Diabetes Father   . Heart disease Maternal Grandmother   . Heart disease Maternal Grandfather   . Diabetes Paternal Grandmother   . Heart disease Paternal Grandmother     MI    No Known Allergies  Medication list has been reviewed and updated.  No current outpatient prescriptions on file prior to visit.   No current facility-administered medications on file prior to visit.    Review of Systems:  As per HPI, otherwise negative.    Physical Examination: Filed Vitals:   11/12/13 1859  BP: 98/60  Pulse: 72  Temp: 99.1 F (37.3 C)  Resp: 16   Filed Vitals:   11/12/13 1859  Height: 5\' 5"  (1.651 m)  Weight: 202 lb (91.627 kg)   Body mass index is 33.61 kg/(m^2). Ideal Body Weight: Weight in (lb) to have BMI = 25: 149.9   GEN: WDWN, NAD, Non-toxic, Alert & Oriented x 3 HEENT: Atraumatic, Normocephalic.  Ears and Nose:  No external deformity. EXTR: No clubbing/cyanosis/edema NEURO: Normal gait.  PSYCH: Normally interactive. Conversant. Not depressed or anxious appearing.  Calm demeanor.  RIGHT knee:  Tender and guards.  Can't flex past 90 degrees.  Joint stable  Assessment and Plan: Internal derangement knee Ortho consult due age of injury   Signed,  Phillips OdorJeffery Lonni Dirden, MD   [facility] reading (PRIMARY) by  Dr. Dareen PianoAnderson.  negative.

## 2014-09-22 ENCOUNTER — Ambulatory Visit (INDEPENDENT_AMBULATORY_CARE_PROVIDER_SITE_OTHER): Payer: BC Managed Care – PPO | Admitting: Physician Assistant

## 2014-09-22 VITALS — BP 120/82 | HR 79 | Temp 98.6°F | Resp 16 | Ht 65.0 in | Wt 188.0 lb

## 2014-09-22 DIAGNOSIS — M62830 Muscle spasm of back: Secondary | ICD-10-CM

## 2014-09-22 DIAGNOSIS — M5431 Sciatica, right side: Secondary | ICD-10-CM

## 2014-09-22 MED ORDER — CYCLOBENZAPRINE HCL 10 MG PO TABS: 10.0000 mg | ORAL_TABLET | Freq: Three times a day (TID) | ORAL | Status: AC | PRN
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
## 2014-09-22 NOTE — Patient Instructions (Addendum)
Ibuprofen 800mg , up to 2400mg /day.   Back Exercises Back exercises help treat and prevent back injuries. The goal of back exercises is to increase the strength of your abdominal and back muscles and the flexibility of your back. These exercises should be started when you no longer have back pain. Back exercises include:  Pelvic Tilt. Lie on your back with your knees bent. Tilt your pelvis until the lower part of your back is against the floor. Hold this position 5 to 10 sec and repeat 5 to 10 times.  Knee to Chest. Pull first 1 knee up against your chest and hold for 20 to 30 seconds, repeat this with the other knee, and then both knees. This may be done with the other leg straight or bent, whichever feels better.  Sit-Ups or Curl-Ups. Bend your knees 90 degrees. Start with tilting your pelvis, and do a partial, slow sit-up, lifting your trunk only 30 to 45 degrees off the floor. Take at least 2 to 3 seconds for each sit-up. Do not do sit-ups with your knees out straight. If partial sit-ups are difficult, simply do the above but with only tightening your abdominal muscles and holding it as directed.  Hip-Lift. Lie on your back with your knees flexed 90 degrees. Push down with your feet and shoulders as you raise your hips a couple inches off the floor; hold for 10 seconds, repeat 5 to 10 times.  Back arches. Lie on your stomach, propping yourself up on bent elbows. Slowly press on your hands, causing an arch in your low back. Repeat 3 to 5 times. Any initial stiffness and discomfort should lessen with repetition over time.  Shoulder-Lifts. Lie face down with arms beside your body. Keep hips and torso pressed to floor as you slowly lift your head and shoulders off the floor. Do not overdo your exercises, especially in the beginning. Exercises may cause you some mild back discomfort which lasts for a few minutes; however, if the pain is more severe, or lasts for more than 15 minutes, do not continue  exercises until you see your caregiver. Improvement with exercise therapy for back problems is slow.  See your caregivers for assistance with developing a proper back exercise program. Document Released: 11/15/2004 Document Revised: 12/31/2011 Document Reviewed: 08/09/2011 [facility] Patient Information 2015 [employer]. This information is not intended to replace advice given to you by your health care provider. Make sure you discuss any questions you have with your health care provider. Sciatica with Rehab The sciatic nerve runs from the back down the leg and is responsible for sensation and control of the muscles in the back (posterior) side of the thigh, lower leg, and foot. Sciatica is a condition that is characterized by inflammation of this nerve.  SYMPTOMS   Signs of nerve damage, including numbness and/or weakness along the posterior side of the lower extremity.  Pain in the back of the thigh that may also travel down the leg.  Pain that worsens when sitting for long periods of time.  Occasionally, pain in the back or buttock. CAUSES  Inflammation of the sciatic nerve is the cause of sciatica. The inflammation is due to something irritating the nerve. Common sources of irritation include:  Sitting for long periods of time.  Direct trauma to the nerve.  Arthritis of the spine.  Herniated or ruptured disk.  Slipping of the vertebrae (spondylolisthesis).  Pressure from soft tissues, such as muscles or ligament-like tissue (fascia). RISK INCREASES WITH:  Sports  that place pressure or stress on the spine (football or weightlifting).  Poor strength and flexibility.  Failure to warm up properly before activity.  Family history of low back pain or disk disorders.  Previous back injury or surgery.  Poor body mechanics, especially when lifting, or poor posture. PREVENTION   Warm up and stretch properly before activity.  Maintain physical fitness:  Strength,  flexibility, and endurance.  Cardiovascular fitness.  Learn and use proper technique, especially with posture and lifting. When possible, have coach correct improper technique.  Avoid activities that place stress on the spine. PROGNOSIS If treated properly, then sciatica usually resolves within 6 weeks. However, occasionally surgery is necessary.  RELATED COMPLICATIONS   Permanent nerve damage, including pain, numbness, tingle, or weakness.  Chronic back pain.  Risks of surgery: infection, bleeding, nerve damage, or damage to surrounding tissues. TREATMENT Treatment initially involves resting from any activities that aggravate your symptoms. The use of ice and medication may help reduce pain and inflammation. The use of strengthening and stretching exercises may help reduce pain with activity. These exercises may be performed at home or with referral to a therapist. A therapist may recommend further treatments, such as transcutaneous electronic nerve stimulation (TENS) or ultrasound. Your caregiver may recommend corticosteroid injections to help reduce inflammation of the sciatic nerve. If symptoms persist despite non-surgical (conservative) treatment, then surgery may be recommended. MEDICATION  If pain medication is necessary, then nonsteroidal anti-inflammatory medications, such as aspirin and ibuprofen, or other minor pain relievers, such as acetaminophen, are often recommended.  Do not take pain medication for 7 days before surgery.  Prescription pain relievers may be given if deemed necessary by your caregiver. Use only as directed and only as much as you need.  Ointments applied to the skin may be helpful.  Corticosteroid injections may be given by your caregiver. These injections should be reserved for the most serious cases, because they may only be given a certain number of times. HEAT AND COLD  Cold treatment (icing) relieves pain and reduces inflammation. Cold treatment  should be applied for 10 to 15 minutes every 2 to 3 hours for inflammation and pain and immediately after any activity that aggravates your symptoms. Use ice packs or massage the area with a piece of ice (ice massage).  Heat treatment may be used prior to performing the stretching and strengthening activities prescribed by your caregiver, physical therapist, or athletic trainer. Use a heat pack or soak the injury in warm water. SEEK MEDICAL CARE IF:  Treatment seems to offer no benefit, or the condition worsens.  Any medications produce adverse side effects. EXERCISES  RANGE OF MOTION (ROM) AND STRETCHING EXERCISES - Sciatica Most people with sciatic will find that their symptoms worsen with either excessive bending forward (flexion) or arching at the low back (extension). The exercises which will help resolve your symptoms will focus on the opposite motion. Your physician, physical therapist or athletic trainer will help you determine which exercises will be most helpful to resolve your low back pain. Do not complete any exercises without first consulting with your clinician. Discontinue any exercises which worsen your symptoms until you speak to your clinician. If you have pain, numbness or tingling which travels down into your buttocks, leg or foot, the goal of the therapy is for these symptoms to move closer to your back and eventually resolve. Occasionally, these leg symptoms will get better, but your low back pain may worsen; this is typically an indication of progress  in your rehabilitation. Be certain to be very alert to any changes in your symptoms and the activities in which you participated in the 24 hours prior to the change. Sharing this information with your clinician will allow him/her to most efficiently treat your condition. These exercises may help you when beginning to rehabilitate your injury. Your symptoms may resolve with or without further involvement from your physician, physical  therapist or athletic trainer. While completing these exercises, remember:   Restoring tissue flexibility helps normal motion to return to the joints. This allows healthier, less painful movement and activity.  An effective stretch should be held for at least 30 seconds.  A stretch should never be painful. You should only feel a gentle lengthening or release in the stretched tissue. FLEXION RANGE OF MOTION AND STRETCHING EXERCISES: STRETCH - Flexion, Single Knee to Chest   Lie on a firm bed or floor with both legs extended in front of you.  Keeping one leg in contact with the floor, bring your opposite knee to your chest. Hold your leg in place by either grabbing behind your thigh or at your knee.  Pull until you feel a gentle stretch in your low back. Hold __________ seconds.  Slowly release your grasp and repeat the exercise with the opposite side. Repeat __________ times. Complete this exercise __________ times per day.  STRETCH - Flexion, Double Knee to Chest  Lie on a firm bed or floor with both legs extended in front of you.  Keeping one leg in contact with the floor, bring your opposite knee to your chest.  Tense your stomach muscles to support your back and then lift your other knee to your chest. Hold your legs in place by either grabbing behind your thighs or at your knees.  Pull both knees toward your chest until you feel a gentle stretch in your low back. Hold __________ seconds.  Tense your stomach muscles and slowly return one leg at a time to the floor. Repeat __________ times. Complete this exercise __________ times per day.  STRETCH - Low Trunk Rotation   Lie on a firm bed or floor. Keeping your legs in front of you, bend your knees so they are both pointed toward the ceiling and your feet are flat on the floor.  Extend your arms out to the side. This will stabilize your upper body by keeping your shoulders in contact with the floor.  Gently and slowly drop both  knees together to one side until you feel a gentle stretch in your low back. Hold for __________ seconds.  Tense your stomach muscles to support your low back as you bring your knees back to the starting position. Repeat the exercise to the other side. Repeat __________ times. Complete this exercise __________ times per day  EXTENSION RANGE OF MOTION AND FLEXIBILITY EXERCISES: STRETCH - Extension, Prone on Elbows  Lie on your stomach on the floor, a bed will be too soft. Place your palms about shoulder width apart and at the height of your head.  Place your elbows under your shoulders. If this is too painful, stack pillows under your chest.  Allow your body to relax so that your hips drop lower and make contact more completely with the floor.  Hold this position for __________ seconds.  Slowly return to lying flat on the floor. Repeat __________ times. Complete this exercise __________ times per day.  RANGE OF MOTION - Extension, Prone Press Ups  Lie on your stomach on the floor,  a bed will be too soft. Place your palms about shoulder width apart and at the height of your head.  Keeping your back as relaxed as possible, slowly straighten your elbows while keeping your hips on the floor. You may adjust the placement of your hands to maximize your comfort. As you gain motion, your hands will come more underneath your shoulders.  Hold this position __________ seconds.  Slowly return to lying flat on the floor. Repeat __________ times. Complete this exercise __________ times per day.  STRENGTHENING EXERCISES - Sciatica  These exercises may help you when beginning to rehabilitate your injury. These exercises should be done near your "sweet spot." This is the neutral, low-back arch, somewhere between fully rounded and fully arched, that is your least painful position. When performed in this safe range of motion, these exercises can be used for people who have either a flexion or extension based  injury. These exercises may resolve your symptoms with or without further involvement from your physician, physical therapist or athletic trainer. While completing these exercises, remember:   Muscles can gain both the endurance and the strength needed for everyday activities through controlled exercises.  Complete these exercises as instructed by your physician, physical therapist or athletic trainer. Progress with the resistance and repetition exercises only as your caregiver advises.  You may experience muscle soreness or fatigue, but the pain or discomfort you are trying to eliminate should never worsen during these exercises. If this pain does worsen, stop and make certain you are following the directions exactly. If the pain is still present after adjustments, discontinue the exercise until you can discuss the trouble with your clinician. STRENGTHENING - Deep Abdominals, Pelvic Tilt   Lie on a firm bed or floor. Keeping your legs in front of you, bend your knees so they are both pointed toward the ceiling and your feet are flat on the floor.  Tense your lower abdominal muscles to press your low back into the floor. This motion will rotate your pelvis so that your tail bone is scooping upwards rather than pointing at your feet or into the floor.  With a gentle tension and even breathing, hold this position for __________ seconds. Repeat __________ times. Complete this exercise __________ times per day.  STRENGTHENING - Abdominals, Crunches   Lie on a firm bed or floor. Keeping your legs in front of you, bend your knees so they are both pointed toward the ceiling and your feet are flat on the floor. Cross your arms over your chest.  Slightly tip your chin down without bending your neck.  Tense your abdominals and slowly lift your trunk high enough to just clear your shoulder blades. Lifting higher can put excessive stress on the low back and does not further strengthen your abdominal  muscles.  Control your return to the starting position. Repeat __________ times. Complete this exercise __________ times per day.  STRENGTHENING - Quadruped, Opposite UE/LE Lift  Assume a hands and knees position on a firm surface. Keep your hands under your shoulders and your knees under your hips. You may place padding under your knees for comfort.  Find your neutral spine and gently tense your abdominal muscles so that you can maintain this position. Your shoulders and hips should form a rectangle that is parallel with the floor and is not twisted.  Keeping your trunk steady, lift your right hand no higher than your shoulder and then your left leg no higher than your hip. Make sure you  are not holding your breath. Hold this position __________ seconds.  Continuing to keep your abdominal muscles tense and your back steady, slowly return to your starting position. Repeat with the opposite arm and leg. Repeat __________ times. Complete this exercise __________ times per day.  STRENGTHENING - Abdominals and Quadriceps, Straight Leg Raise   Lie on a firm bed or floor with both legs extended in front of you.  Keeping one leg in contact with the floor, bend the other knee so that your foot can rest flat on the floor.  Find your neutral spine, and tense your abdominal muscles to maintain your spinal position throughout the exercise.  Slowly lift your straight leg off the floor about 6 inches for a count of 15, making sure to not hold your breath.  Still keeping your neutral spine, slowly lower your leg all the way to the floor. Repeat this exercise with each leg __________ times. Complete this exercise __________ times per day. POSTURE AND BODY MECHANICS CONSIDERATIONS - Sciatica Keeping correct posture when sitting, standing or completing your activities will reduce the stress put on different body tissues, allowing injured tissues a chance to heal and limiting painful experiences. The  following are general guidelines for improved posture. Your physician or physical therapist will provide you with any instructions specific to your needs. While reading these guidelines, remember:  The exercises prescribed by your provider will help you have the flexibility and strength to maintain correct postures.  The correct posture provides the optimal environment for your joints to work. All of your joints have less wear and tear when properly supported by a spine with good posture. This means you will experience a healthier, less painful body.  Correct posture must be practiced with all of your activities, especially prolonged sitting and standing. Correct posture is as important when doing repetitive low-stress activities (typing) as it is when doing a single heavy-load activity (lifting). RESTING POSITIONS Consider which positions are most painful for you when choosing a resting position. If you have pain with flexion-based activities (sitting, bending, stooping, squatting), choose a position that allows you to rest in a less flexed posture. You would want to avoid curling into a fetal position on your side. If your pain worsens with extension-based activities (prolonged standing, working overhead), avoid resting in an extended position such as sleeping on your stomach. Most people will find more comfort when they rest with their spine in a more neutral position, neither too rounded nor too arched. Lying on a non-sagging bed on your side with a pillow between your knees, or on your back with a pillow under your knees will often provide some relief. Keep in mind, being in any one position for a prolonged period of time, no matter how correct your posture, can still lead to stiffness. PROPER SITTING POSTURE In order to minimize stress and discomfort on your spine, you must sit with correct posture Sitting with good posture should be effortless for a healthy body. Returning to good posture is a  gradual process. Many people can work toward this most comfortably by using various supports until they have the flexibility and strength to maintain this posture on their own. When sitting with proper posture, your ears will fall over your shoulders and your shoulders will fall over your hips. You should use the back of the chair to support your upper back. Your low back will be in a neutral position, just slightly arched. You may place a small pillow or folded  towel at the base of your low back for support.  When working at a desk, create an environment that supports good, upright posture. Without extra support, muscles fatigue and lead to excessive strain on joints and other tissues. Keep these recommendations in mind: CHAIR:   A chair should be able to slide under your desk when your back makes contact with the back of the chair. This allows you to work closely.  The chair's height should allow your eyes to be level with the upper part of your monitor and your hands to be slightly lower than your elbows. BODY POSITION  Your feet should make contact with the floor. If this is not possible, use a foot rest.  Keep your ears over your shoulders. This will reduce stress on your neck and low back. INCORRECT SITTING POSTURES   If you are feeling tired and unable to assume a healthy sitting posture, do not slouch or slump. This puts excessive strain on your back tissues, causing more damage and pain. Healthier options include:  Using more support, like a lumbar pillow.  Switching tasks to something that requires you to be upright or walking.  Talking a brief walk.  Lying down to rest in a neutral-spine position. PROLONGED STANDING WHILE SLIGHTLY LEANING FORWARD  When completing a task that requires you to lean forward while standing in one place for a long time, place either foot up on a stationary 2-4 inch high object to help maintain the best posture. When both feet are on the ground, the low  back tends to lose its slight inward curve. If this curve flattens (or becomes too large), then the back and your other joints will experience too much stress, fatigue more quickly and can cause pain.  CORRECT STANDING POSTURES Proper standing posture should be assumed with all daily activities, even if they only take a few moments, like when brushing your teeth. As in sitting, your ears should fall over your shoulders and your shoulders should fall over your hips. You should keep a slight tension in your abdominal muscles to brace your spine. Your tailbone should point down to the ground, not behind your body, resulting in an over-extended swayback posture.  INCORRECT STANDING POSTURES  Common incorrect standing postures include a forward head, locked knees and/or an excessive swayback. WALKING Walk with an upright posture. Your ears, shoulders and hips should all line-up. PROLONGED ACTIVITY IN A FLEXED POSITION When completing a task that requires you to bend forward at your waist or lean over a low surface, try to find a way to stabilize 3 of 4 of your limbs. You can place a hand or elbow on your thigh or rest a knee on the surface you are reaching across. This will provide you more stability so that your muscles do not fatigue as quickly. By keeping your knees relaxed, or slightly bent, you will also reduce stress across your low back. CORRECT LIFTING TECHNIQUES DO :   Assume a wide stance. This will provide you more stability and the opportunity to get as close as possible to the object which you are lifting.  Tense your abdominals to brace your spine; then bend at the knees and hips. Keeping your back locked in a neutral-spine position, lift using your leg muscles. Lift with your legs, keeping your back straight.  Test the weight of unknown objects before attempting to lift them.  Try to keep your elbows locked down at your sides in order get the best strength  from your shoulders when  carrying an object.  Always ask for help when lifting heavy or awkward objects. INCORRECT LIFTING TECHNIQUES DO NOT:   Lock your knees when lifting, even if it is a small object.  Bend and twist. Pivot at your feet or move your feet when needing to change directions.  Assume that you cannot safely pick up a paperclip without proper posture. Document Released: 10/08/2005 Document Revised: 02/22/2014 Document Reviewed: 01/20/2009 [facility] Patient Information 2015 [place], [place]. This information is not intended to replace advice given to you by your health care provider. Make sure you discuss any questions you have with your health care provider.

## 2014-09-27 NOTE — Progress Notes (Signed)
I have discussed this case with Ms. English, PA-C and agree.  

## 2017-11-28 ENCOUNTER — Ambulatory Visit (INDEPENDENT_AMBULATORY_CARE_PROVIDER_SITE_OTHER): Payer: BC Managed Care – PPO

## 2017-11-28 ENCOUNTER — Ambulatory Visit (HOSPITAL_COMMUNITY)
Admission: EM | Admit: 2017-11-28 | Discharge: 2017-11-28 | Disposition: A | Discharge status: Home / Self Care | Payer: BC Managed Care – PPO | Attending: Family Medicine | Admitting: Family Medicine

## 2017-11-28 ENCOUNTER — Encounter (HOSPITAL_COMMUNITY): Payer: Self-pay | Admitting: Emergency Medicine

## 2017-11-28 DIAGNOSIS — M79672 Pain in left foot: Secondary | ICD-10-CM

## 2017-11-28 MED ORDER — DICLOFENAC SODIUM 75 MG PO TBEC: 75.0000 mg | DELAYED_RELEASE_TABLET | Freq: Two times a day (BID) | ORAL | 0 refills | Status: DC

## 2017-11-28 NOTE — ED Triage Notes (Signed)
Pt c/o foot pain since yesterday, states she woke up out of her sleep and it was severe pain. Denies known injury.

## 2017-11-29 ENCOUNTER — Ambulatory Visit: Payer: BC Managed Care – PPO | Admitting: Medical

## 2017-11-29 ENCOUNTER — Encounter: Payer: Self-pay | Admitting: Medical

## 2017-11-29 VITALS — BP 117/77 | HR 81 | Temp 98.0°F | Resp 16 | Ht 65.0 in | Wt 203.6 lb

## 2017-11-29 DIAGNOSIS — R739 Hyperglycemia, unspecified: Secondary | ICD-10-CM

## 2017-11-29 DIAGNOSIS — F419 Anxiety disorder, unspecified: Secondary | ICD-10-CM

## 2017-11-29 DIAGNOSIS — G4700 Insomnia, unspecified: Secondary | ICD-10-CM

## 2017-11-29 DIAGNOSIS — M25572 Pain in left ankle and joints of left foot: Secondary | ICD-10-CM

## 2017-11-29 LAB — COMPREHENSIVE METABOLIC PANEL
ALK PHOS: 69 U/L (ref 39–117)
ALT: 17 U/L (ref 0–35)
AST: 15 U/L (ref 0–37)
Albumin: 3.7 g/dL (ref 3.5–5.2)
BILIRUBIN TOTAL: 0.3 mg/dL (ref 0.2–1.2)
BUN: 5 mg/dL — ABNORMAL LOW (ref 6–23)
CALCIUM: 8.7 mg/dL (ref 8.4–10.5)
CO2: 28 mEq/L (ref 19–32)
Chloride: 104 mEq/L (ref 96–112)
Creatinine, Ser: 0.7 mg/dL (ref 0.40–1.20)
GFR: 117.22 mL/min (ref >60.00)
Glucose, Bld: 120 mg/dL — ABNORMAL HIGH (ref 70–99)
POTASSIUM: 3.5 meq/L (ref 3.5–5.1)
Sodium: 135 mEq/L (ref 135–145)
TOTAL PROTEIN: 6.8 g/dL (ref 6.0–8.3)

## 2017-11-29 LAB — SEDIMENTATION RATE: SED RATE: 11 mm/h (ref 0–20)

## 2017-11-29 LAB — URIC ACID: URIC ACID, SERUM: 3.7 mg/dL (ref 2.4–7.0)

## 2017-11-29 LAB — HEMOGLOBIN A1C: HEMOGLOBIN A1C: 5.9 % (ref 4.6–6.5)

## 2017-11-29 MED ORDER — TRAZODONE HCL 50 MG PO TABS: 25.0000 mg | ORAL_TABLET | Freq: Every evening | ORAL | 0 refills | Status: DC | PRN

## 2017-11-29 MED FILL — traZODone HCL 50 MG TABS: 50 | 30 days supply | Qty: 30 | Fill #0

## 2017-11-29 NOTE — Patient Instructions (Signed)
For your history of insomnia with some possible anxiety and depressed mood, I want to prescribe trazodone to see if this helps her sleep.  I have hopes that with better sleep that your mood and anxiety will lessen.  If not we will consider adding medication such as sertraline or may be Klonopin.  With your history of high sugars December, I want to get a metabolic panel and A1c today.  Also I would recommend some daily exercise to help reduce stress and clear mind.  Follow-up 2 weeks or as needed.

## 2017-11-29 NOTE — Progress Notes (Signed)
Subjective:    Patient ID: Yvette Brennan, female    DOB: [birth year], 42 y.o.   MRN: [id number]  HPI  Pt in today to establish care. Last seen in 2014.   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 No alcohol. Single. No children. She does cross word puzzles.   Has history of allergic rhinitis. Some wheezing if allergies were severe. Wheezing started as adult. 2 years since any wheezing.  Some slight wheezing every other day. Drinks more water and takes tums. If she gets severe reflux will take 3 thumbs.  Pt has history of insomnia. She thinks maybe work related. She states can't relax and turn her mind off. Pt states work stresses her out. Pt not sure if mood depressed. Pt states she has to do a lot of paperwork related to her job.   Over the summer pt at gyn office her a1-c was borderline.    LMP- 11/01/2017.  Review of Systems  Constitutional: Negative for chills, fatigue and fever.  HENT: Negative for congestion, ear discharge, ear pain and facial swelling.   Respiratory: Negative for cough, chest tightness, shortness of breath and wheezing.   Cardiovascular: Negative for chest pain and palpitations.  Gastrointestinal: Negative for abdominal pain.  Musculoskeletal: Negative for back pain.  Skin: Negative for rash.  Neurological: Negative for dizziness, seizures and speech difficulty.  Hematological: Negative for adenopathy. Does not bruise/bleed easily.  Psychiatric/Behavioral: Positive for dysphoric mood and sleep disturbance. Negative for agitation, behavioral problems and suicidal ideas. The patient is nervous/anxious.     Past Medical History:  Diagnosis Date  . Allergy   . Anxiety   . GERD (gastroesophageal reflux disease)    almost every other day.     Social History   Socioeconomic History  . Marital status: Single    Spouse name: Not on file  . Number of children: Not on  file  . Years of education: Not on file  . Highest education level: Not on file  Social Needs  . Financial resource strain: Not on file  . Food insecurity - worry: Not on file  . Food insecurity - inability: Not on file  . Transportation needs - medical: Not on file  . Transportation needs - non-medical: Not on file  Occupational History  . Not on file  Tobacco Use  . Smoking status: Current Every Day Smoker    Packs/day: 0.10    Years: 10.00    Pack years: 1.00    Types: Cigars  . Smokeless tobacco: Never Used  Substance and Sexual Activity  . Alcohol use: No  . Drug use: No  . Sexual activity: No    Birth control/protection: Abstinence  Other Topics Concern  . Not on file  Social History Narrative   Exercise--no    No past surgical history on file.  Family History  Problem Relation Age of Onset  . Diabetes Father   . Heart disease Maternal Grandmother   . Heart disease Maternal Grandfather   . Diabetes Paternal Grandmother   . Heart disease Paternal Grandmother        MI    No Known Allergies  Current Outpatient Medications on File Prior to Visit  Medication Sig Dispense Refill  . diclofenac (VOLTAREN) 75 MG EC tablet Take 1 tablet (75 mg total) by mouth 2 (two) times daily. 14 tablet 0   No current facility-administered medications on file prior to  visit.     BP 117/77   Pulse 81   Temp 98 F (36.7 C) (Oral)   Resp 16   Ht 5\' 5"  (1.651 m)   Wt 203 lb 9.6 oz (92.4 kg)   LMP 11/01/2017 (Exact Date)   SpO2 100%   BMI 33.88 kg/m       Objective:   Physical Exam  General Mental Status- Alert. General Appearance- Not in acute distress.   Skin General: Color- Normal Color. Moisture- Normal Moisture.  Neck Carotid Arteries- Normal color. Moisture- Normal Moisture. No carotid bruits. No JVD.  Chest and Lung Exam Auscultation: Breath Sounds:-Normal.  Cardiovascular Auscultation:Rythm- Regular. Murmurs & Other Heart Sounds:Auscultation of the  heart reveals- No Murmurs.  Abdomen Inspection:-Inspeection Normal. Palpation/Percussion:Note:No mass. Palpation and Percussion of the abdomen reveal- Non Tender, Non Distended + BS, no rebound or guarding.  Neurologic Cranial Nerve exam:- CN III-XII intact(No nystagmus), symmetric smile. Strength:- 5/5 equal and symmetric strength both upper and lower extremities.      Assessment & Plan:  For your history of insomnia with some possible anxiety and depressed mood, I want to prescribe trazodone to see if this helps her sleep.  I have hopes that with better sleep that your mood and anxiety will lessen.  If not we will consider adding medication such as sertraline or may be Klonopin.  With your history of high sugars December, I want to get a metabolic panel and A1c today.  Also I would recommend some daily exercise to help reduce stress and clear mind.  Follow-up 2 weeks or as needed.  Esperanza RichtersEdward Nichole Neyer, PA-C

## 2017-11-29 NOTE — Addendum Note (Signed)
Addended by: Gwenevere AbbotSAGUIER, Kyshawn Teal M on: 11/29/2017 02:22 PM   Modules accepted: Orders

## 2017-12-02 ENCOUNTER — Telehealth: Payer: Self-pay | Admitting: Medical

## 2017-12-02 LAB — ANTI-NUCLEAR AB-TITER (ANA TITER): ANA Titer 1: 1:40 {titer} — ABNORMAL HIGH

## 2017-12-02 LAB — RHEUMATOID FACTOR: Rhuematoid fact SerPl-aCnc: <14 IU/mL (ref <14)

## 2017-12-02 LAB — ANA: Anti Nuclear Antibody(ANA): POSITIVE — AB

## 2017-12-02 NOTE — Telephone Encounter (Signed)
Pt given results per notes of  Dr. Alvira MondaySaguier on 11/29/17.Unable to document in result note due to [facility].

## 2017-12-04 ENCOUNTER — Telehealth: Payer: Self-pay | Admitting: Medical

## 2017-12-04 DIAGNOSIS — R768 Other specified abnormal immunological findings in serum: Secondary | ICD-10-CM

## 2017-12-04 NOTE — Telephone Encounter (Signed)
  Referral to rheumatologist placed. 

## 2017-12-11 ENCOUNTER — Ambulatory Visit: Payer: BC Managed Care – PPO | Admitting: Medical

## 2017-12-13 ENCOUNTER — Ambulatory Visit: Payer: BC Managed Care – PPO | Admitting: Medical

## 2017-12-18 ENCOUNTER — Ambulatory Visit: Payer: BC Managed Care – PPO | Admitting: Medical

## 2017-12-18 ENCOUNTER — Encounter: Payer: Self-pay | Admitting: Medical

## 2017-12-18 VITALS — BP 114/72 | HR 85 | Temp 98.3°F | Resp 16 | Ht 65.0 in | Wt 204.4 lb

## 2017-12-18 DIAGNOSIS — F419 Anxiety disorder, unspecified: Secondary | ICD-10-CM

## 2017-12-18 DIAGNOSIS — G4700 Insomnia, unspecified: Secondary | ICD-10-CM

## 2017-12-18 MED ORDER — TRAZODONE HCL 50 MG PO TABS
25.0000 mg | ORAL_TABLET | Freq: Every evening | ORAL | 0 refills | Status: DC | PRN
Start: 2017-12-18 — End: 2019-05-01

## 2017-12-18 MED ORDER — LORAZEPAM 0.5 MG PO TABS: 0.5000 mg | ORAL_TABLET | Freq: Three times a day (TID) | ORAL | 1 refills | Status: DC | PRN

## 2017-12-18 MED ORDER — TRAZODONE HCL 50 MG PO TABS: 25.0000 mg | ORAL_TABLET | Freq: Every evening | ORAL | 0 refills | Status: DC | PRN

## 2017-12-18 MED FILL — LORazepam 0.5 MG TABS: 0.5 | 5 days supply | Qty: 15 | Fill #0
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
## 2017-12-18 NOTE — Patient Instructions (Signed)
For your history of insomnia, anxiety and mild depressed mood, recommend you continue trazadone.  Under extreme stressful situation I think good idea to prescribe you ativan. Use if needed but sparingly.   Follow up in 10 days or as needed

## 2017-12-19 ENCOUNTER — Ambulatory Visit: Payer: BC Managed Care – PPO | Admitting: Medical

## 2018-01-13 MED FILL — traZODone HCL 50 MG TABS: 50 | 30 days supply | Qty: 30 | Fill #0

## 2018-03-16 IMAGING — DX DG FOOT COMPLETE 3+V*L*
3 series · 3 of 3 positions shown · non-contrast
Comparison: None

CLINICAL DATA: LEFT foot and ankle pain upon awakening, LEFT foot
appears swollen and red, no known injury

EXAM:
LEFT FOOT - COMPLETE 3+ VIEW

[foot ap]
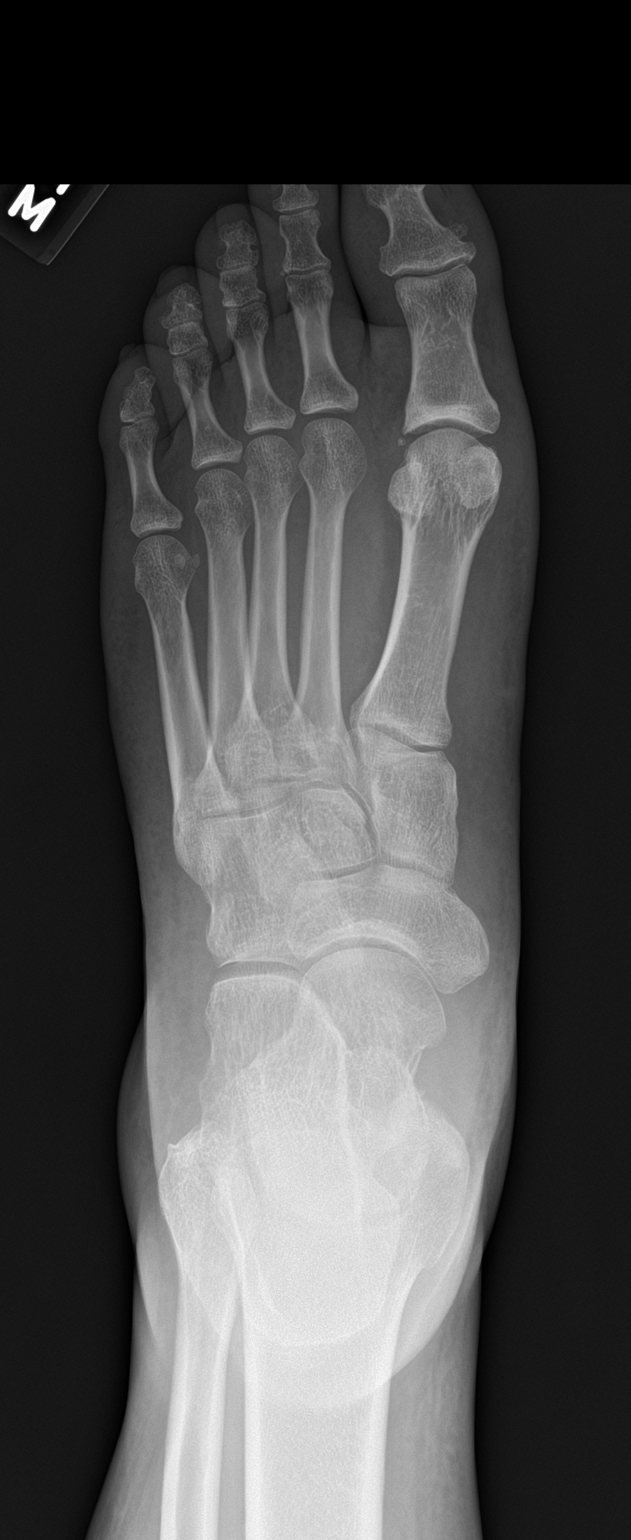

[foot obl]
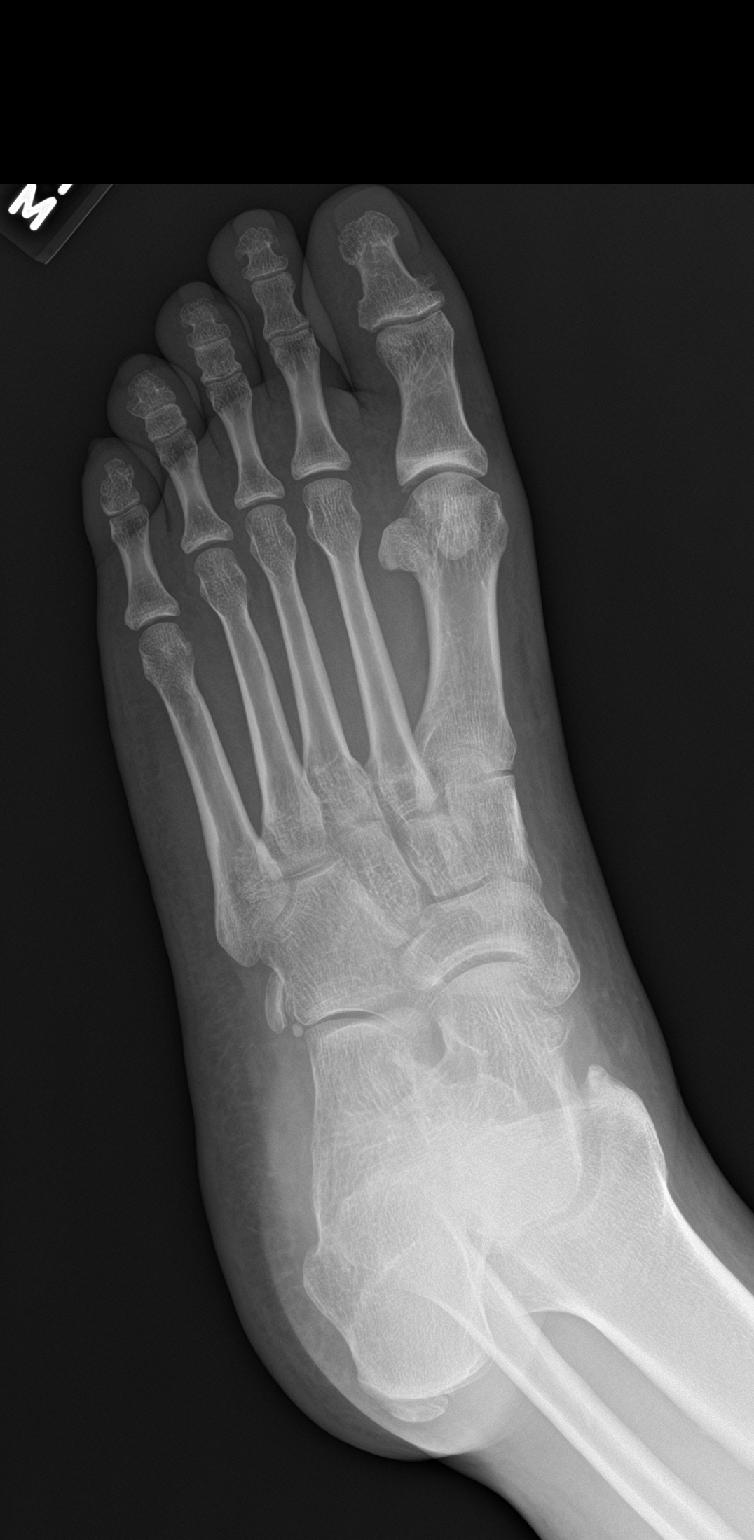

[foot lat]
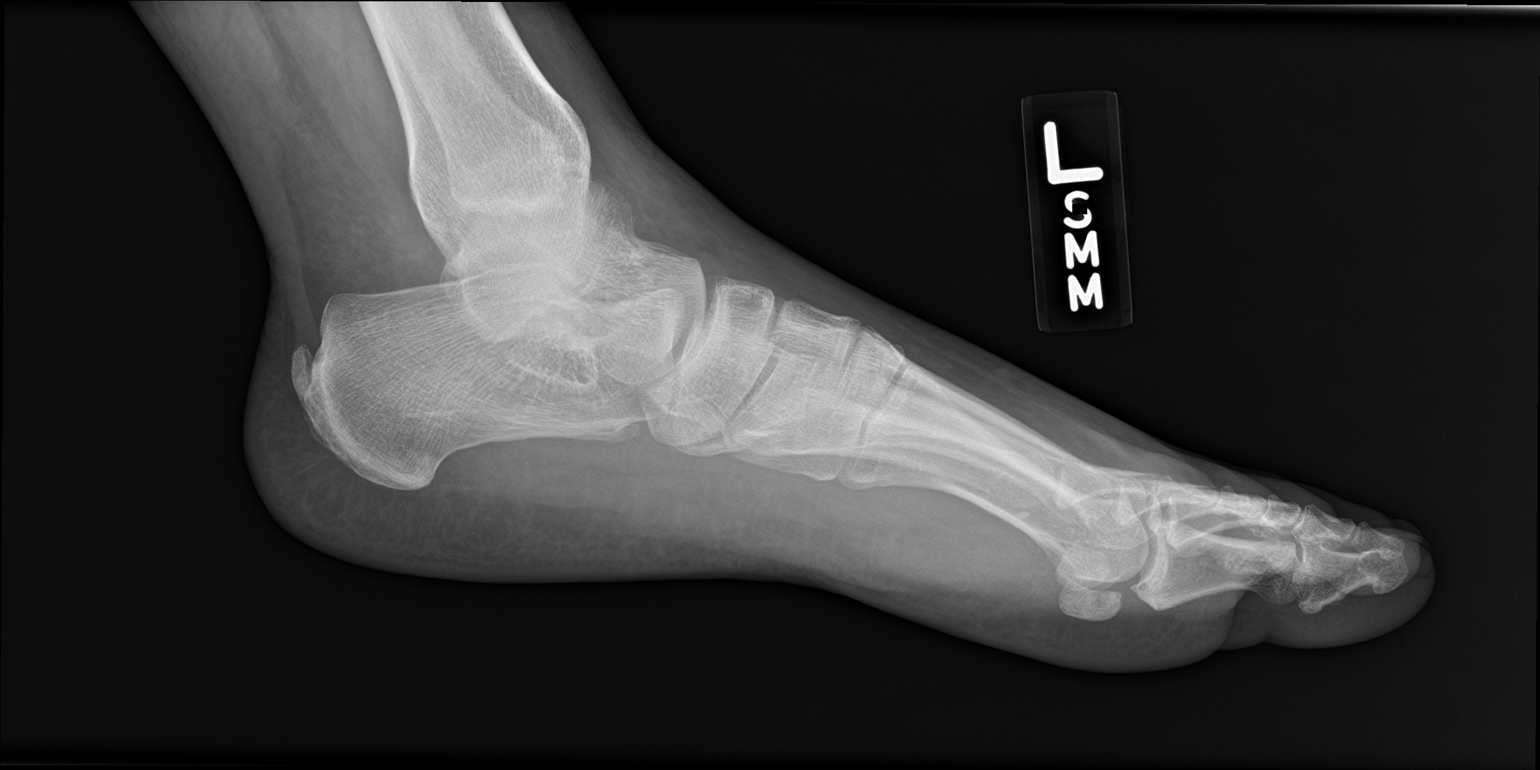

[3 of 3 positions shown; findings below may reference images not displayed]

FINDINGS: Osseous mineralization normal.

Joint spaces preserved.

No fracture, dislocation, or bone destruction.

Scattered soft tissue swelling.

Small Achilles insertion calcaneal spur.
IMPRESSION: No acute osseous abnormalities.

## 2018-03-16 IMAGING — DX DG ANKLE COMPLETE 3+V*L*
3 series · 3 of 3 positions shown · non-contrast
Comparison: None

CLINICAL DATA: LEFT foot and ankle pain upon awakening, LEFT foot
appears swollen and red, no known injury

EXAM:
LEFT ANKLE COMPLETE - 3+ VIEW

[ankle ap]
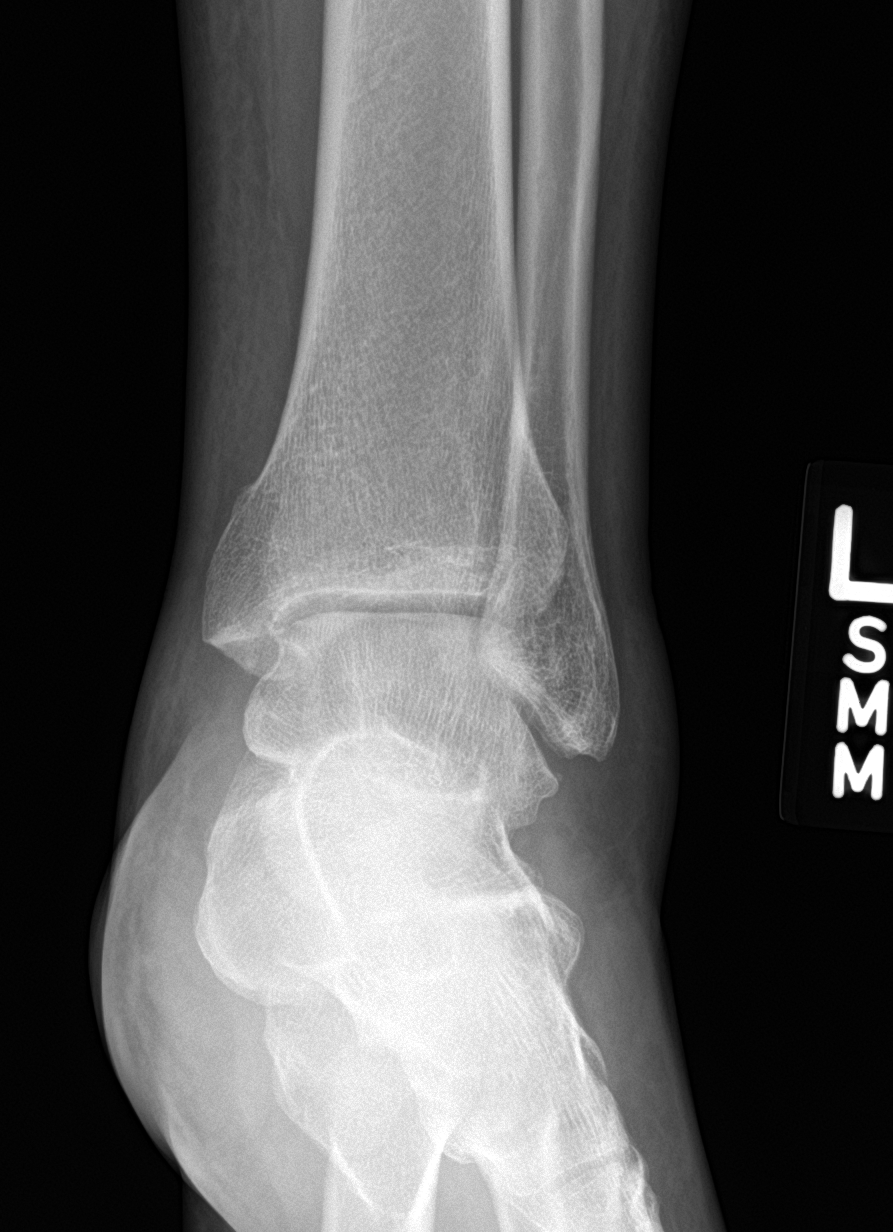

[ankle obl]
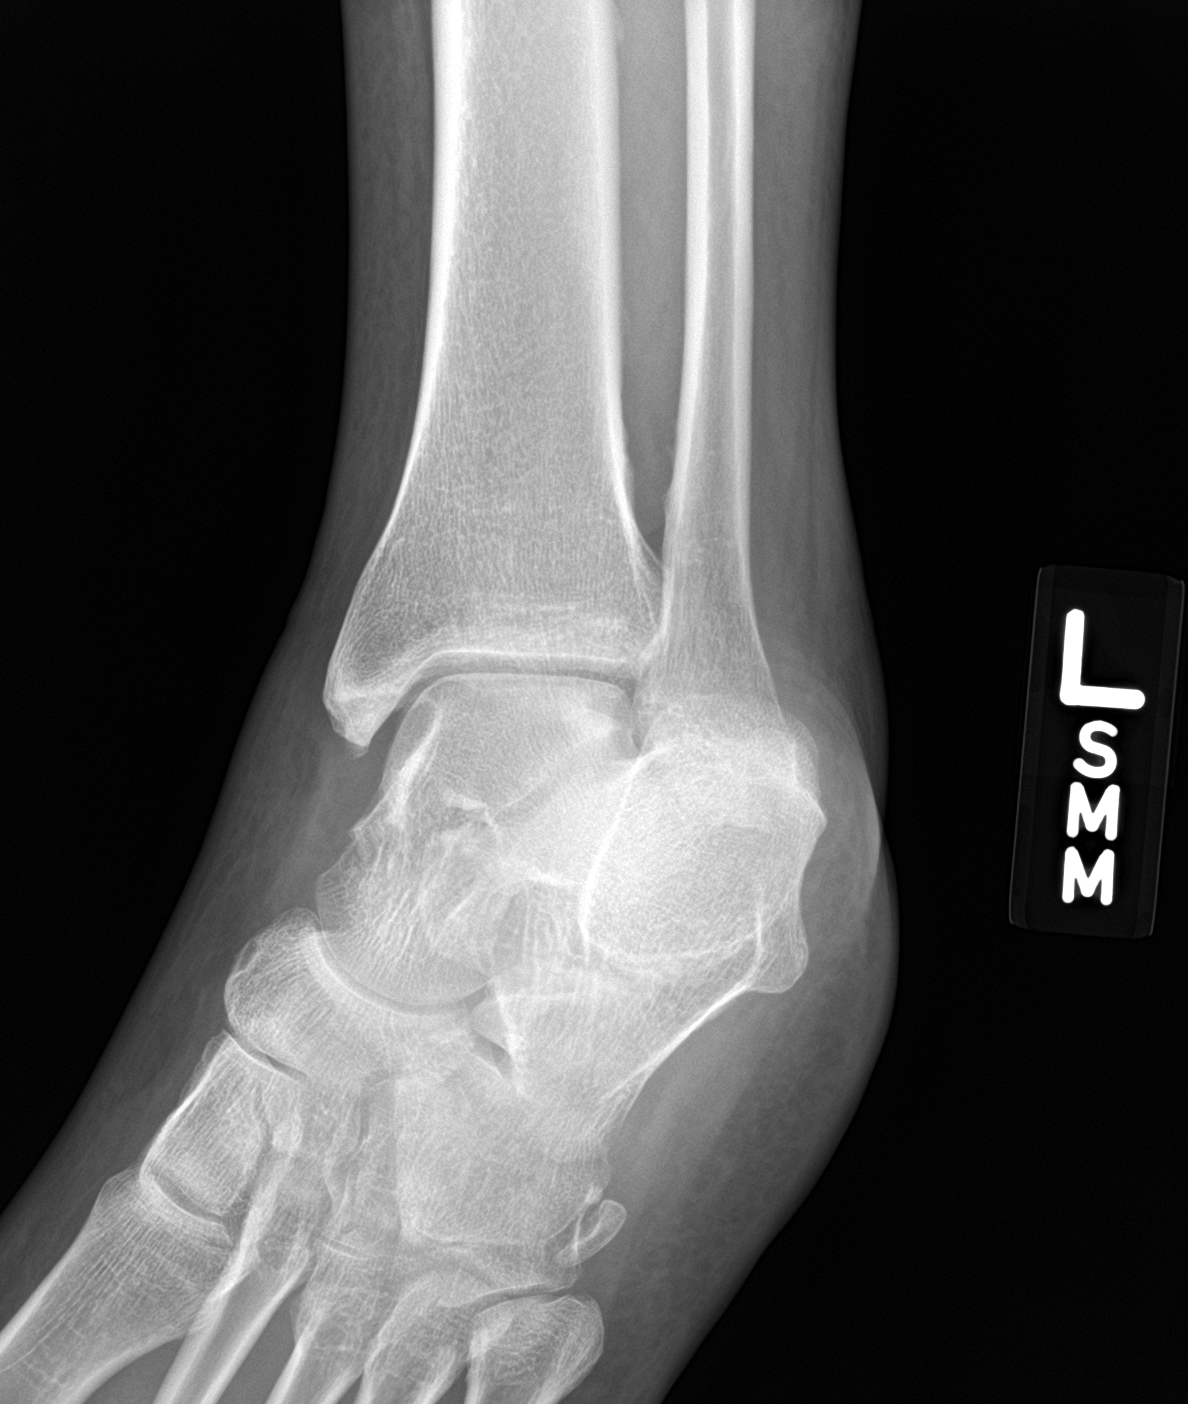

[ankle lat]
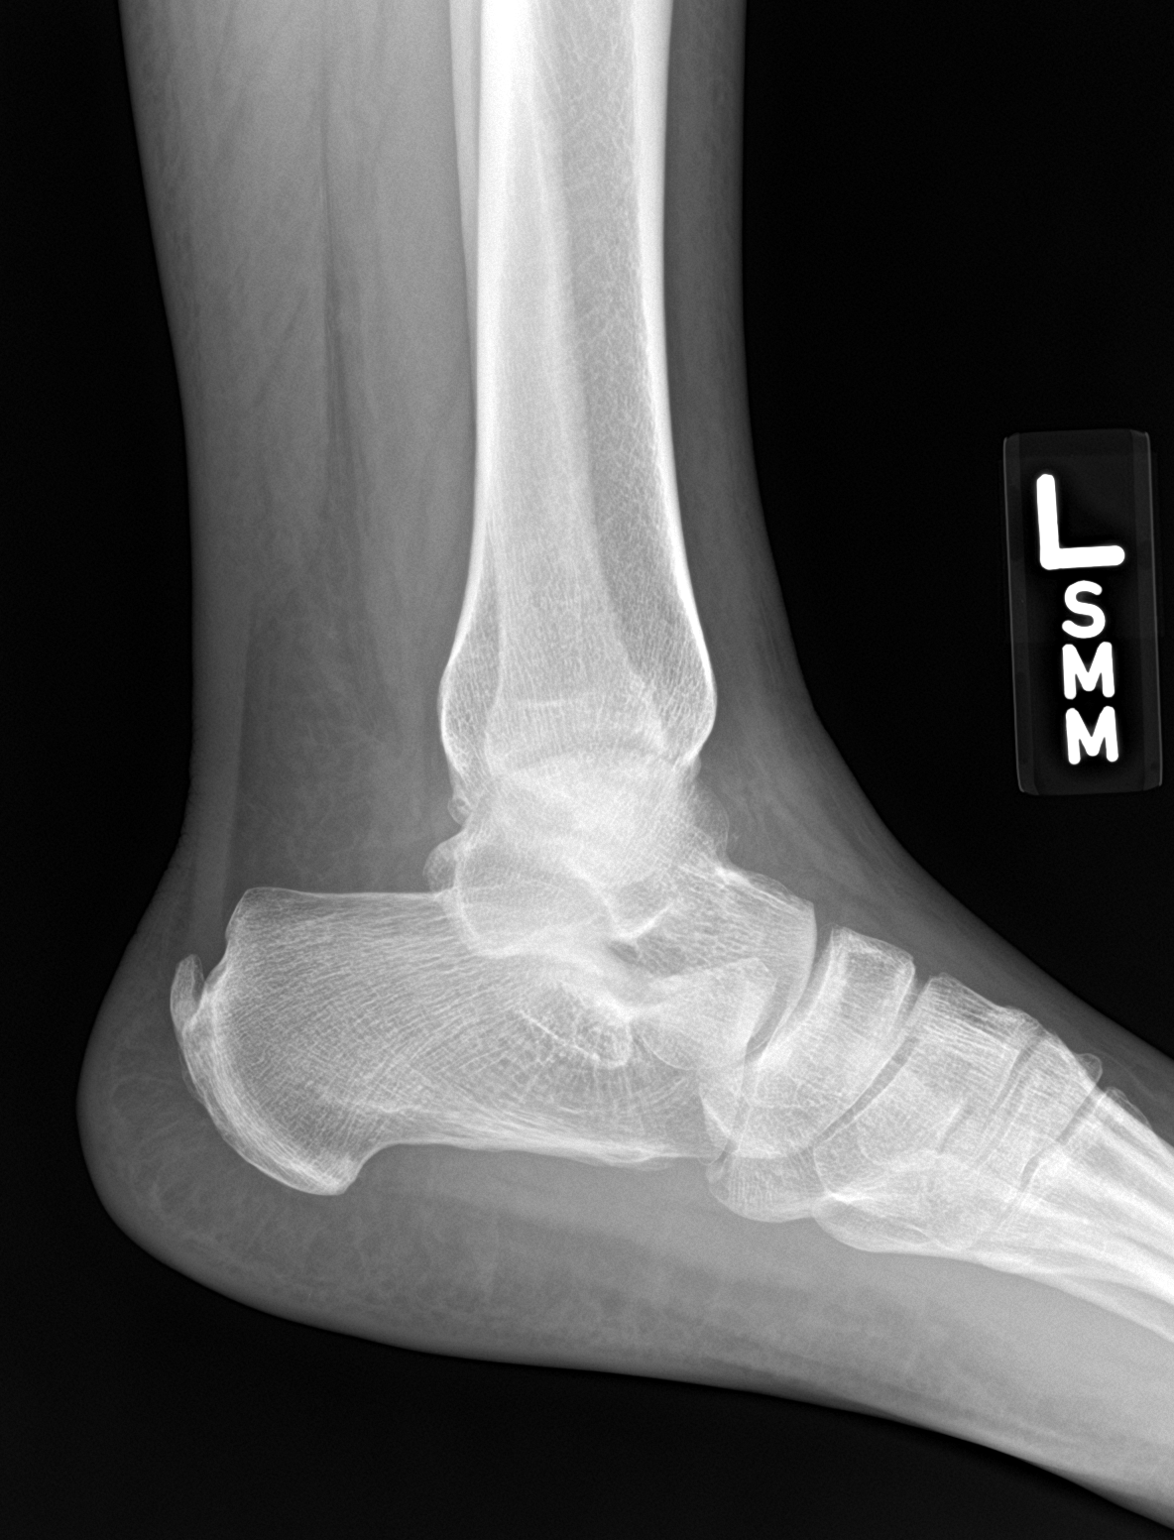

[3 of 3 positions shown; findings below may reference images not displayed]

FINDINGS: Diffuse soft tissue swelling at LEFT ankle and foot.

Osseous mineralization normal.

Joint spaces preserved.

No acute fracture, dislocation, or bone destruction.

Achilles insertion calcaneal spur.
IMPRESSION: Soft tissue swelling without acute bony abnormalities.

## 2019-04-22 ENCOUNTER — Other Ambulatory Visit: Payer: Self-pay

## 2019-05-01 ENCOUNTER — Other Ambulatory Visit: Payer: Self-pay

## 2019-05-01 ENCOUNTER — Encounter: Payer: Self-pay | Admitting: Medical

## 2019-05-01 ENCOUNTER — Ambulatory Visit (INDEPENDENT_AMBULATORY_CARE_PROVIDER_SITE_OTHER): Payer: BC Managed Care – PPO | Admitting: Medical

## 2019-05-01 VITALS — BP 110/68 | HR 80 | Temp 98.8°F | Resp 18 | Ht 65.0 in | Wt 176.4 lb

## 2019-05-01 DIAGNOSIS — Z0000 Encounter for general adult medical examination without abnormal findings: Secondary | ICD-10-CM | POA: Diagnosis not present

## 2019-05-01 DIAGNOSIS — Z113 Encounter for screening for infections with a predominantly sexual mode of transmission: Secondary | ICD-10-CM

## 2019-05-01 DIAGNOSIS — Z23 Encounter for immunization: Secondary | ICD-10-CM | POA: Diagnosis not present

## 2019-05-01 LAB — LIPID PANEL
Cholesterol: 166 mg/dL (ref 0–200)
HDL: 54.6 mg/dL (ref >39.00)
LDL Cholesterol: 100 mg/dL — ABNORMAL HIGH (ref 0–99)
NonHDL: 111.17
Total CHOL/HDL Ratio: 3
Triglycerides: 57 mg/dL (ref 0.0–149.0)
VLDL: 11.4 mg/dL (ref 0.0–40.0)

## 2019-05-01 LAB — COMPREHENSIVE METABOLIC PANEL
ALT: 10 U/L (ref 0–35)
AST: 10 U/L (ref 0–37)
Albumin: 3.7 g/dL (ref 3.5–5.2)
Alkaline Phosphatase: 73 U/L (ref 39–117)
BUN: 7 mg/dL (ref 6–23)
CO2: 25 mEq/L (ref 19–32)
Calcium: 8.3 mg/dL — ABNORMAL LOW (ref 8.4–10.5)
Chloride: 108 mEq/L (ref 96–112)
Creatinine, Ser: 0.69 mg/dL (ref 0.40–1.20)
GFR: 111.41 mL/min (ref >60.00)
Glucose, Bld: 81 mg/dL (ref 70–99)
Potassium: 4.4 mEq/L (ref 3.5–5.1)
Sodium: 139 mEq/L (ref 135–145)
Total Bilirubin: 0.2 mg/dL (ref 0.2–1.2)
Total Protein: 6.3 g/dL (ref 6.0–8.3)

## 2019-05-01 LAB — CBC WITH DIFFERENTIAL/PLATELET
Basophils Absolute: 0 10*3/uL (ref 0.0–0.1)
Basophils Relative: 0.5 % (ref 0.0–3.0)
Eosinophils Absolute: 0.1 10*3/uL (ref 0.0–0.7)
Eosinophils Relative: 2.5 % (ref 0.0–5.0)
HCT: 38.2 % (ref 36.0–46.0)
Hemoglobin: 12.8 g/dL (ref 12.0–15.0)
Lymphocytes Relative: 30.5 % (ref 12.0–46.0)
Lymphs Abs: 1.5 10*3/uL (ref 0.7–4.0)
MCHC: 33.4 g/dL (ref 30.0–36.0)
MCV: 98 fl (ref 78.0–100.0)
Monocytes Absolute: 0.6 10*3/uL (ref 0.1–1.0)
Monocytes Relative: 12.4 % — ABNORMAL HIGH (ref 3.0–12.0)
Neutro Abs: 2.6 10*3/uL (ref 1.4–7.7)
Neutrophils Relative %: 54.1 % (ref 43.0–77.0)
Platelets: 296 10*3/uL (ref 150.0–400.0)
RBC: 3.9 Mil/uL (ref 3.87–5.11)
RDW: 15.3 % (ref 11.5–15.5)
WBC: 4.9 10*3/uL (ref 4.0–10.5)

## 2019-05-01 MED ORDER — TRAZODONE HCL 50 MG PO TABS: 25.0000 mg | ORAL_TABLET | Freq: Every day | ORAL | 11 refills | Status: DC

## 2019-05-01 MED ORDER — LORAZEPAM 0.5 MG PO TABS: 0.5000 mg | ORAL_TABLET | Freq: Three times a day (TID) | ORAL | 0 refills | Status: DC | PRN

## 2019-05-01 MED FILL — LORazepam 0.5 MG TABS: 0.5 | 5 days supply | Qty: 15 | Fill #0

## 2019-05-01 MED FILL — traZODone HCL 50 MG TABS: 50 | 30 days supply | Qty: 30 | Fill #0

## 2019-05-01 NOTE — Addendum Note (Signed)
Addended by: Sanda Linger on: 05/01/2019 10:46 AM   Modules accepted: Orders

## 2019-05-01 NOTE — Progress Notes (Signed)
Subjective:    Patient ID: Yvette Brennan, female    DOB: 09-25-1976, 43 y.o.   MRN: [id number]  HPI Pt scheduled for wellness exam/cpe.   Per epic review need hiv screen and tdap. Other vaccines up to date. Reminded pt in early fall get flu vaccine.  Exercise-2 days a week. Diet- recently her diet has improved.  Smoking-smoke black and mild one pack every 2-3 days. Started smoking cessation plan at work. Alcohol-no Caffeine-drinks 1-2 cups of unsweet tea a day.  I don't see piror mammogram in chart. Pt states she just got that this year. April 15, 2019. Was normal. Last pap was also in June 2020. She states not told the results.   Pt purposely started eating healthier and less in February. Now lost about 30 pounds,     Review of Systems  Constitutional: Negative for chills and fever.  HENT: Negative for congestion, ear discharge, facial swelling, postnasal drip, rhinorrhea, sinus pressure and sinus pain.   Respiratory: Negative for cough, chest tightness, shortness of breath and wheezing.   Cardiovascular: Negative for chest pain and palpitations.  Gastrointestinal: Negative for abdominal pain, blood in stool, diarrhea and vomiting.  Genitourinary: Negative for dysuria, flank pain, frequency, hematuria and vaginal pain.  Musculoskeletal: Negative for back pain, myalgias and neck stiffness.  Skin: Negative for rash.  Neurological: Negative for dizziness and light-headedness.  Hematological: Negative for adenopathy. Does not bruise/bleed easily.  Psychiatric/Behavioral: Negative for behavioral problems, decreased concentration, self-injury and suicidal ideas. The patient is nervous/anxious.        Hx of anxiety. 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 In past rare/sporadic use  Also recent insomnia.    Past Medical History:  Diagnosis Date  . Allergy   . Anxiety   . GERD (gastroesophageal reflux disease)    almost  every other day.     Social History   Socioeconomic History  . Marital status: Single    Spouse name: Not on file  . Number of children: Not on file  . Years of education: Not on file  . Highest education level: Not on file  Occupational History  . Not on file  Social Needs  . Financial resource strain: Not on file  . Food insecurity    Worry: Not on file    Inability: Not on file  . Transportation needs    Medical: Not on file    Non-medical: Not on file  Tobacco Use  . Smoking status: Current Every Day Smoker    Packs/day: 0.10    Years: 10.00    Pack years: 1.00    Types: Cigars  . Smokeless tobacco: Never Used  Substance and Sexual Activity  . Alcohol use: No  . Drug use: No  . Sexual activity: Never    Birth control/protection: Abstinence  Lifestyle  . Physical activity    Days per week: Not on file    Minutes per session: Not on file  . Stress: Not on file  Relationships  . Social Herbalist on phone: Not on file    Gets together: Not on file    Attends religious service: Not on file    Active member of club or organization: Not on file    Attends meetings of clubs or organizations: Not on file    Relationship status: Not on file  . Intimate partner violence    Fear of current or ex partner:  Not on file    Emotionally abused: Not on file    Physically abused: Not on file    Forced sexual activity: Not on file  Other Topics Concern  . Not on file  Social History Narrative   Exercise--no    No past surgical history on file.  Family History  Problem Relation Age of Onset  . Diabetes Father   . Heart disease Maternal Grandmother   . Heart disease Maternal Grandfather   . Diabetes Paternal Grandmother   . Heart disease Paternal Grandmother        MI    No Known Allergies  Current Outpatient Medications on File Prior to Visit  Medication Sig Dispense Refill  . diclofenac (VOLTAREN) 75 MG EC tablet Take 1 tablet (75 mg total) by mouth  2 (two) times daily. 14 tablet 0  . LORazepam (ATIVAN) 0.5 MG tablet Take 1 tablet (0.5 mg total) by mouth every 8 (eight) hours as needed for anxiety. 15 tablet 1  . traZODone (DESYREL) 50 MG tablet Take 0.5-1 tablets (25-50 mg total) by mouth at bedtime as needed for sleep. 30 tablet 0   No current facility-administered medications on file prior to visit.     There were no vitals taken for this visit.      Objective:   Physical Exam  General Mental Status- Alert. General Appearance- Not in acute distress.   Skin General: Color- Normal Color. Moisture- Normal Moisture.  Neck Carotid Arteries- Normal color. Moisture- Normal Moisture. No carotid bruits. No JVD.  Chest and Lung Exam Auscultation: Breath Sounds:-Normal.  Cardiovascular Auscultation:Rythm- Regular. Murmurs & Other Heart Sounds:Auscultation of the heart reveals- No Murmurs.  Abdomen Inspection:-Inspeection Normal. Palpation/Percussion:Note:No mass. Palpation and Percussion of the abdomen reveal- Non Tender, Non Distended + BS, no rebound or guarding.   Neurologic Cranial Nerve exam:- CN III-XII intact(No nystagmus), symmetric smile. Strength:- 5/5 equal and symmetric strength both upper and lower extremities.      Assessment & Plan:  For you wellness exam today I have ordered cbc, cmp, lipid panel and hiv.  Vaccine given today tdap  Recommend exercise and healthy diet.  We will let you know lab results as they come in.  For anxiety rx ativan to use sparingly.  For insomnia rx trazadone .  Follow up date appointment will be determined after lab review.   Esperanza RichtersEdward Aws Shere, PA-C

## 2019-05-01 NOTE — Patient Instructions (Addendum)
For you wellness exam today I have ordered cbc, cmp, lipid panel and hiv.  Vaccine given today tdap  Recommend exercise and healthy diet.  We will let you know lab results as they come in.  For anxiety rx ativan to use sparingly.  For insomnia rx trazadone .   Follow up date appointment will be determined after lab review.    Preventive Care 1-46 Years Old, Female Preventive care refers to visits with your health care provider and lifestyle choices that can promote health and wellness. This includes:  A yearly physical exam. This may also be called an annual well check.  Regular dental visits and eye exams.  Immunizations.  Screening for certain conditions.  Healthy lifestyle choices, such as eating a healthy diet, getting regular exercise, not using drugs or products that contain nicotine and tobacco, and limiting alcohol use. What can I expect for my preventive care visit? Physical exam Your health care provider will check your:  Height and weight. This may be used to calculate body mass index (BMI), which tells if you are at a healthy weight.  Heart rate and blood pressure.  Skin for abnormal spots. Counseling Your health care provider may ask you questions about your:  Alcohol, tobacco, and drug use.  Emotional well-being.  Home and relationship well-being.  Sexual activity.  Eating habits.  Work and work Statistician.  Method of birth control.  Menstrual cycle.  Pregnancy history. What immunizations do I need?  Influenza (flu) vaccine  This is recommended every year. Tetanus, diphtheria, and pertussis (Tdap) vaccine  You may need a Td booster every 10 years. Varicella (chickenpox) vaccine  You may need this if you have not been vaccinated. Zoster (shingles) vaccine  You may need this after age 62. Measles, mumps, and rubella (MMR) vaccine  You may need at least one dose of MMR if you were born in 1957 or later. You may also need a second  dose. Pneumococcal conjugate (PCV13) vaccine  You may need this if you have certain conditions and were not previously vaccinated. Pneumococcal polysaccharide (PPSV23) vaccine  You may need one or two doses if you smoke cigarettes or if you have certain conditions. Meningococcal conjugate (MenACWY) vaccine  You may need this if you have certain conditions. Hepatitis A vaccine  You may need this if you have certain conditions or if you travel or work in places where you may be exposed to hepatitis A. Hepatitis B vaccine  You may need this if you have certain conditions or if you travel or work in places where you may be exposed to hepatitis B. Haemophilus influenzae type b (Hib) vaccine  You may need this if you have certain conditions. Human papillomavirus (HPV) vaccine  If recommended by your health care provider, you may need three doses over 6 months. You may receive vaccines as individual doses or as more than one vaccine together in one shot (combination vaccines). Talk with your health care provider about the risks and benefits of combination vaccines. What tests do I need? Blood tests  Lipid and cholesterol levels. These may be checked every 5 years, or more frequently if you are over 21 years old.  Hepatitis C test.  Hepatitis B test. Screening  Lung cancer screening. You may have this screening every year starting at age 88 if you have a 30-pack-year history of smoking and currently smoke or have quit within the past 15 years.  Colorectal cancer screening. All adults should have this screening starting  at age 73 and continuing until age 68. Your health care provider may recommend screening at age 12 if you are at increased risk. You will have tests every 1-10 years, depending on your results and the type of screening test.  Diabetes screening. This is done by checking your blood sugar (glucose) after you have not eaten for a while (fasting). You may have this done every  1-3 years.  Mammogram. This may be done every 1-2 years. Talk with your health care provider about when you should start having regular mammograms. This may depend on whether you have a family history of breast cancer.  BRCA-related cancer screening. This may be done if you have a family history of breast, ovarian, tubal, or peritoneal cancers.  Pelvic exam and Pap test. This may be done every 3 years starting at age 53. Starting at age 65, this may be done every 5 years if you have a Pap test in combination with an HPV test. Other tests  Sexually transmitted disease (STD) testing.  Bone density scan. This is done to screen for osteoporosis. You may have this scan if you are at high risk for osteoporosis. Follow these instructions at home: Eating and drinking  Eat a diet that includes fresh fruits and vegetables, whole grains, lean protein, and low-fat dairy.  Take vitamin and mineral supplements as recommended by your health care provider.  Do not drink alcohol if: ? Your health care provider tells you not to drink. ? You are pregnant, may be pregnant, or are planning to become pregnant.  If you drink alcohol: ? Limit how much you have to 0-1 drink a day. ? Be aware of how much alcohol is in your drink. In the [place] one drink equals one 12 oz bottle of beer (355 mL), one 5 oz glass of wine (148 mL), or one 1 oz glass of hard liquor (44 mL). Lifestyle  Take daily care of your teeth and gums.  Stay active. Exercise for at least 30 minutes on 5 or more days each week.  Do not use any products that contain nicotine or tobacco, such as cigarettes, e-cigarettes, and chewing tobacco. If you need help quitting, ask your health care provider.  If you are sexually active, practice safe sex. Use a condom or other form of birth control (contraception) in order to prevent pregnancy and STIs (sexually transmitted infections).  If told by your health care provider, take low-dose aspirin daily  starting at age 57. What's next?  Visit your health care provider once a year for a well check visit.  Ask your health care provider how often you should have your eyes and teeth checked.  Stay up to date on all vaccines. This information is not intended to replace advice given to you by your health care provider. Make sure you discuss any questions you have with your health care provider. Document Released: 11/04/2015 Document Revised: 06/19/2018 Document Reviewed: 06/19/2018 Elsevier Patient Education  2020 [employer].

## 2019-05-02 LAB — HIV ANTIBODY (ROUTINE TESTING W REFLEX): HIV 1&2 Ab, 4th Generation: NONREACTIVE

## 2019-06-11 ENCOUNTER — Telehealth: Payer: Self-pay

## 2019-06-11 NOTE — Telephone Encounter (Signed)
Copied from [place] [phone]. Topic: General - Other >> Jun 11, 2019 12:12 PM Burchel, Abbi R wrote: Reason for CRM:  Pt would like to speak with Percell Miller or his assistant re: a note stating medical necessity to work remotely (ie from home) Please call pt to discuss:  [phone]

## 2019-06-11 NOTE — Telephone Encounter (Signed)
Pt needs Virtual visit  

## 2019-06-12 ENCOUNTER — Other Ambulatory Visit: Payer: Self-pay

## 2019-06-12 ENCOUNTER — Ambulatory Visit (INDEPENDENT_AMBULATORY_CARE_PROVIDER_SITE_OTHER): Payer: BC Managed Care – PPO | Admitting: Medical

## 2019-06-12 VITALS — Wt 175.0 lb

## 2019-06-12 DIAGNOSIS — M329 Systemic lupus erythematosus, unspecified: Secondary | ICD-10-CM

## 2019-06-12 DIAGNOSIS — F17200 Nicotine dependence, unspecified, uncomplicated: Secondary | ICD-10-CM

## 2019-06-12 DIAGNOSIS — IMO0002 Reserved for concepts with insufficient information to code with codable children: Secondary | ICD-10-CM

## 2019-06-12 NOTE — Progress Notes (Signed)
   Subjective:    Patient ID: Yvette Brennan, female    DOB: 23-Aug-1976, 43 y.o.   MRN: [id number]  HPI  Virtual Visit via Video Note  I connected with Yvette Brennan on 06/12/19 at  3:00 PM EDT by a video enabled telemedicine application and verified that I am speaking with the correct person using two identifiers.  Location: Patient: car at work.parked. Provider: office.   I discussed the limitations of evaluation and management by telemedicine and the availability of in person appointments. The patient expressed understanding and agreed to proceed.  History of Present Illness:   Pt is a Pharmacist, hospital. She wants to work remotely during the pandemic. Pt states HR told her if she met criteria can work at home. Pt is smoker and diagnosed with lupus. She states this is on criteria list. 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  She smokes 3 cigars a day.  In 2018 she stats dx with lupus by specialist. She saw Dr. Jalene Mullet in [place] who made dx. Pt had elevated ANA in the past.  A lot of parents are deciding to do virtual schooling.    Observations/Objective: General-no acute distress, pleasant, oriented. Lungs- on inspection lungs appear unlabored. Neck- no tracheal deviation or jvd on inspection. Neuro- gross motor function appears intact.  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  She has concerned about her potential complications in the event that she were to get COVID.  She does have history of obesity, history of smoking and lupus.    Risk score is 1 presently but could be higher in the event that she were to demonstrate lung issue based on her history of smoking.  She does also note of diagnosis of lupus per MD in [place].    On review of chart it appears she did make it to rheumatologist referral appointment that I attempted to set up.  We will go ahead and send note that she can print through  my chart.  Follow-up as needed.  Mackie Pai, PA-C  Follow Up Instructions:    I discussed the assessment and treatment plan with the patient. The patient was provided an opportunity to ask questions and all were answered. The patient agreed with the plan and demonstrated an understanding of the instructions.   The patient was advised to call back or seek an in-person evaluation if the symptoms worsen or if the condition fails to improve as anticipated.  I provided 25 minutes of non-face-to-face time during this encounter.    Mackie Pai, PA-C    Review of Systems     Objective:   Physical Exam        Assessment & Plan:

## 2019-06-12 NOTE — Patient Instructions (Addendum)
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  She has concerned about her potential complications in the event that she were to get COVID.  She does have history of obesity, history of smoking and lupus.    Risk score is 1 presently but could be higher in the event that she were to demonstrate lung issue based on her history of smoking.  She does also note of diagnosis of lupus per MD in [place].    On review of chart it appears she did make it to rheumatologist referral appointment that I attempted to set up.  We will go ahead and send note that she can print through my chart.  Follow-up as needed.

## 2019-09-10 MED FILL — traZODone HCL 50 MG TABS: 50 | 30 days supply | Qty: 30 | Fill #1

## 2021-07-26 ENCOUNTER — Encounter: Payer: Self-pay | Admitting: Medical

## 2021-07-26 ENCOUNTER — Ambulatory Visit (INDEPENDENT_AMBULATORY_CARE_PROVIDER_SITE_OTHER): Payer: BC Managed Care – PPO | Admitting: Medical

## 2021-07-26 VITALS — BP 114/58 | HR 80 | Resp 18 | Ht 65.0 in | Wt 226.0 lb

## 2021-07-26 DIAGNOSIS — Z1211 Encounter for screening for malignant neoplasm of colon: Secondary | ICD-10-CM | POA: Diagnosis not present

## 2021-07-26 DIAGNOSIS — Z0000 Encounter for general adult medical examination without abnormal findings: Secondary | ICD-10-CM

## 2021-07-26 DIAGNOSIS — Z111 Encounter for screening for respiratory tuberculosis: Secondary | ICD-10-CM | POA: Diagnosis not present

## 2021-07-26 LAB — CBC WITH DIFFERENTIAL/PLATELET
Basophils Absolute: 0 10*3/uL (ref 0.0–0.1)
Basophils Relative: 0.6 % (ref 0.0–3.0)
Eosinophils Absolute: 0.1 10*3/uL (ref 0.0–0.7)
Eosinophils Relative: 2 % (ref 0.0–5.0)
HCT: 37.6 % (ref 36.0–46.0)
Hemoglobin: 12 g/dL (ref 12.0–15.0)
Lymphocytes Relative: 37.8 % (ref 12.0–46.0)
Lymphs Abs: 1.8 10*3/uL (ref 0.7–4.0)
MCHC: 31.9 g/dL (ref 30.0–36.0)
MCV: 91.7 fl (ref 78.0–100.0)
Monocytes Absolute: 0.5 10*3/uL (ref 0.1–1.0)
Monocytes Relative: 11 % (ref 3.0–12.0)
Neutro Abs: 2.3 10*3/uL (ref 1.4–7.7)
Neutrophils Relative %: 48.6 % (ref 43.0–77.0)
Platelets: 323 10*3/uL (ref 150.0–400.0)
RBC: 4.1 Mil/uL (ref 3.87–5.11)
RDW: 15 % (ref 11.5–15.5)
WBC: 4.6 10*3/uL (ref 4.0–10.5)

## 2021-07-26 LAB — COMPREHENSIVE METABOLIC PANEL
ALT: 14 U/L (ref 0–35)
AST: 12 U/L (ref 0–37)
Albumin: 3.8 g/dL (ref 3.5–5.2)
Alkaline Phosphatase: 70 U/L (ref 39–117)
BUN: 6 mg/dL (ref 6–23)
CO2: 25 mEq/L (ref 19–32)
Calcium: 9.1 mg/dL (ref 8.4–10.5)
Chloride: 105 mEq/L (ref 96–112)
Creatinine, Ser: 0.66 mg/dL (ref 0.40–1.20)
GFR: 104.72 mL/min (ref >60.00)
Glucose, Bld: 94 mg/dL (ref 70–99)
Potassium: 4.5 mEq/L (ref 3.5–5.1)
Sodium: 136 mEq/L (ref 135–145)
Total Bilirubin: 0.3 mg/dL (ref 0.2–1.2)
Total Protein: 6.8 g/dL (ref 6.0–8.3)

## 2021-07-26 LAB — LIPID PANEL
Cholesterol: 170 mg/dL (ref 0–200)
HDL: 55.6 mg/dL (ref >39.00)
LDL Cholesterol: 98 mg/dL (ref 0–99)
NonHDL: 113.9
Total CHOL/HDL Ratio: 3
Triglycerides: 78 mg/dL (ref 0.0–149.0)
VLDL: 15.6 mg/dL (ref 0.0–40.0)

## 2021-07-26 NOTE — Progress Notes (Signed)
Subjective:    Patient ID: Yvette Brennan, female    DOB: 05/25/1976, 45 y.o.   MRN: [id number]  HPI  Pt in for cpe/wellness exam. 2 years since I have seen her.  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 No exercising regularly. Pt admits not best diet. But is avoiding red meat. Pt stopped smoking. Stopped May 03, 2020. No alcohol use. Ginger ale about 8-10 a week.  Pt will get mammogram repeat in December thru her gyn at [facility]. Will get pap at that time per pt.  Pt has not had colonoscopy.    Review of Systems  Constitutional:  Negative for chills, fatigue and fever.  HENT:  Negative for congestion and drooling.   Respiratory:  Negative for cough, chest tightness, shortness of breath and wheezing.   Cardiovascular:  Negative for chest pain and palpitations.  Gastrointestinal:  Negative for abdominal pain.  Genitourinary:  Negative for dysuria.  Musculoskeletal:  Negative for back pain, gait problem, myalgias and neck stiffness.  Skin:  Negative for rash.  Neurological:  Negative for dizziness, speech difficulty, weakness, numbness and headaches.  Hematological:  Negative for adenopathy. Does not bruise/bleed easily.  Psychiatric/Behavioral:  Negative for behavioral problems and dysphoric mood. The patient is not nervous/anxious.     Past Medical History:  Diagnosis Date   Allergy    Anxiety    GERD (gastroesophageal reflux disease)    almost every other day.     Social History   Socioeconomic History   Marital status: Single    Spouse name: Not on file   Number of children: Not on file   Years of education: Not on file   Highest education level: Not on file  Occupational History   Not on file  Tobacco Use   Smoking status: Every Day    Packs/day: 0.10    Years: 10.00    Pack years: 1.00    Types: Cigars, Cigarettes   Smokeless tobacco: Never  Substance and Sexual Activity   Alcohol use: No   Drug use: No   Sexual activity:  Never    Birth control/protection: Abstinence  Other Topics Concern   Not on file  Social History Narrative   Exercise--no   Social Determinants of Health   Financial Resource Strain: Not on file  Food Insecurity: Not on file  Transportation Needs: Not on file  Physical Activity: Not on file  Stress: Not on file  Social Connections: Not on file  Intimate Partner Violence: Not on file    No past surgical history on file.  Family History  Problem Relation Age of Onset   Diabetes Father    Heart disease Maternal Grandmother    Heart disease Maternal Grandfather    Diabetes Paternal Grandmother    Heart disease Paternal Grandmother        MI    No Known Allergies  No current outpatient medications on file prior to visit.   No current facility-administered medications on file prior to visit.    BP (!) 114/58   Pulse 80   Resp 18   Ht 5\' 5"  (1.651 m)   Wt 226 lb (102.5 kg)   SpO2 98%   BMI 37.61 kg/m        Objective:   Physical Exam  General Mental Status- Alert. General Appearance- Not in acute distress.   Skin General: Color- Normal Color. Moisture- Normal Moisture.  Neck Carotid Arteries- Normal color. Moisture- Normal Moisture. No  carotid bruits. No JVD.  Chest and Lung Exam Auscultation: Breath Sounds:-Normal.  Cardiovascular Auscultation:Rythm- Regular. Murmurs & Other Heart Sounds:Auscultation of the heart reveals- No Murmurs.  Abdomen Inspection:-Inspeection Normal. Palpation/Percussion:Note:No mass. Palpation and Percussion of the abdomen reveal- Non Tender, Non Distended + BS, no rebound or guarding.  Neurologic Cranial Nerve exam:- CN III-XII intact(No nystagmus), symmetric smile. Strength:- 5/5 equal and symmetric strength both upper and lower extremities.      Assessment & Plan:  For you wellness exam today I have ordered cbc, cmp and lipid panel. TB screening lab as well.  Vaccine flu and covid declined.  Recommend exercise  and healthy diet.  We will let you know lab results as they come in.  Follow up date appointment will be determined after lab review.    Referral to GI MD placed for screening colon cancer.  When you get pap and mammo done thru gyn please have them fax over result notes to [phone].  Esperanza Richters, PA-C

## 2021-07-26 NOTE — Patient Instructions (Addendum)
For you wellness exam today I have ordered cbc, cmp and lipid panel. TB screening lab as well.  Vaccine flu and covid declined.  Recommend exercise and healthy diet.  We will let you know lab results as they come in.  Follow up date appointment will be determined after lab review.    Referral to GI MD placed for screening colon cancer.  When you get pap and mammo done thru gyn please have them fax over result notes to [phone].  Will fill out your [employer] form after tb blood test. Will notify you and see if staff can email you copy since no fax # on form.  Preventive Care 38-98 Years Old, Female Preventive care refers to lifestyle choices and visits with your health care provider that can promote health and wellness. This includes: A yearly physical exam. This is also called an annual wellness visit. Regular dental and eye exams. Immunizations. Screening for certain conditions. Healthy lifestyle choices, such as: Eating a healthy diet. Getting regular exercise. Not using drugs or products that contain nicotine and tobacco. Limiting alcohol use. What can I expect for my preventive care visit? Physical exam Your health care provider will check your: Height and weight. These may be used to calculate your BMI (body mass index). BMI is a measurement that tells if you are at a healthy weight. Heart rate and blood pressure. Body temperature. Skin for abnormal spots. Counseling Your health care provider may ask you questions about your: Past medical problems. Family's medical history. Alcohol, tobacco, and drug use. Emotional well-being. Home life and relationship well-being. Sexual activity. Diet, exercise, and sleep habits. Work and work Statistician. Access to firearms. Method of birth control. Menstrual cycle. Pregnancy history. What immunizations do I need? Vaccines are usually given at various ages, according to a schedule. Your health care provider will  recommend vaccines for you based on your age, medical history, and lifestyle or other factors, such as travel or where you work. What tests do I need? Blood tests Lipid and cholesterol levels. These may be checked every 5 years, or more often if you are over 60 years old. Hepatitis C test. Hepatitis B test. Screening Lung cancer screening. You may have this screening every year starting at age 50 if you have a 30-pack-year history of smoking and currently smoke or have quit within the past 15 years. Colorectal cancer screening. All adults should have this screening starting at age 12 and continuing until age 20. Your health care provider may recommend screening at age 82 if you are at increased risk. You will have tests every 1-10 years, depending on your results and the type of screening test. Diabetes screening. This is done by checking your blood sugar (glucose) after you have not eaten for a while (fasting). You may have this done every 1-3 years. Mammogram. This may be done every 1-2 years. Talk with your health care provider about when you should start having regular mammograms. This may depend on whether you have a family history of breast cancer. BRCA-related cancer screening. This may be done if you have a family history of breast, ovarian, tubal, or peritoneal cancers. Pelvic exam and Pap test. This may be done every 3 years starting at age 5. Starting at age 51, this may be done every 5 years if you have a Pap test in combination with an HPV test. Other tests STD (sexually transmitted disease) testing, if you are at risk. Bone density scan. This is done to  screen for osteoporosis. You may have this scan if you are at high risk for osteoporosis. Talk with your health care provider about your test results, treatment options, and if necessary, the need for more tests. Follow these instructions at home: Eating and drinking  Eat a diet that includes fresh fruits and vegetables,  whole grains, lean protein, and low-fat dairy products. Take vitamin and mineral supplements as recommended by your health care provider. Do not drink alcohol if: Your health care provider tells you not to drink. You are pregnant, may be pregnant, or are planning to become pregnant. If you drink alcohol: Limit how much you have to 0-1 drink a day. Be aware of how much alcohol is in your drink. In the [place] one drink equals one 12 oz bottle of beer (355 mL), one 5 oz glass of wine (148 mL), or one 1 oz glass of hard liquor (44 mL). Lifestyle Take daily care of your teeth and gums. Brush your teeth every morning and night with fluoride toothpaste. Floss one time each day. Stay active. Exercise for at least 30 minutes 5 or more days each week. Do not use any products that contain nicotine or tobacco, such as cigarettes, e-cigarettes, and chewing tobacco. If you need help quitting, ask your health care provider. Do not use drugs. If you are sexually active, practice safe sex. Use a condom or other form of protection to prevent STIs (sexually transmitted infections). If you do not wish to become pregnant, use a form of birth control. If you plan to become pregnant, see your health care provider for a prepregnancy visit. If told by your health care provider, take low-dose aspirin daily starting at age 79. Find healthy ways to cope with stress, such as: Meditation, yoga, or listening to music. Journaling. Talking to a trusted person. Spending time with friends and family. Safety Always wear your seat belt while driving or riding in a vehicle. Do not drive: If you have been drinking alcohol. Do not ride with someone who has been drinking. When you are tired or distracted. While texting. Wear a helmet and other protective equipment during sports activities. If you have firearms in your house, make sure you follow all gun safety procedures. What's next? Visit your health care provider once a  year for an annual wellness visit. Ask your health care provider how often you should have your eyes and teeth checked. Stay up to date on all vaccines. This information is not intended to replace advice given to you by your health care provider. Make sure you discuss any questions you have with your health care provider. Document Revised: 12/16/2020 Document Reviewed: 06/19/2018 Elsevier Patient Education  2022 [employer].

## 2021-07-26 NOTE — Addendum Note (Signed)
Addended by: Rosita Kea on: 07/26/2021 10:44 AM   Modules accepted: Orders

## 2021-07-29 LAB — QUANTIFERON-TB GOLD PLUS
Mitogen-NIL: 8.53 IU/mL
NIL: 0.03 IU/mL
QuantiFERON-TB Gold Plus: NEGATIVE
TB1-NIL: 0 IU/mL
TB2-NIL: 0 IU/mL

## 2021-10-09 ENCOUNTER — Telehealth: Payer: Self-pay | Admitting: Medical

## 2021-10-09 NOTE — Telephone Encounter (Signed)
Pt vit d was low, a1c just at edge of diabtic limit and ldl cholesterol elevated. Gynecologist sent over labs. Can you get her to come in to disuss these labs. 2-3 weeks would be ok.

## 2021-10-10 NOTE — Telephone Encounter (Signed)
Pt called and scheduled virtually

## 2021-10-11 ENCOUNTER — Encounter: Payer: Self-pay | Admitting: Medical

## 2021-10-11 ENCOUNTER — Telehealth (INDEPENDENT_AMBULATORY_CARE_PROVIDER_SITE_OTHER): Payer: BC Managed Care – PPO | Admitting: Medical

## 2021-10-11 ENCOUNTER — Other Ambulatory Visit (HOSPITAL_BASED_OUTPATIENT_CLINIC_OR_DEPARTMENT_OTHER): Payer: Self-pay

## 2021-10-11 VITALS — BP 122/64 | Wt 226.0 lb

## 2021-10-11 DIAGNOSIS — E559 Vitamin D deficiency, unspecified: Secondary | ICD-10-CM | POA: Diagnosis not present

## 2021-10-11 DIAGNOSIS — E785 Hyperlipidemia, unspecified: Secondary | ICD-10-CM

## 2021-10-11 DIAGNOSIS — E119 Type 2 diabetes mellitus without complications: Secondary | ICD-10-CM | POA: Diagnosis not present

## 2021-10-11 MED ORDER — VITAMIN D (ERGOCALCIFEROL) 1.25 MG (50000 UNIT) PO CAPS
50000.0000 [IU] | ORAL_CAPSULE | ORAL | 0 refills | Status: DC
  Filled 2021-10-11: qty 8, 56d supply, fill #0

## 2021-10-11 MED ORDER — METFORMIN HCL 500 MG PO TABS: 500.0000 mg | ORAL_TABLET | Freq: Every day | ORAL | 3 refills | Status: DC

## 2021-10-11 MED ORDER — VITAMIN D (ERGOCALCIFEROL) 1.25 MG (50000 UNIT) PO CAPS: 50000.0000 [IU] | ORAL_CAPSULE | ORAL | 0 refills | Status: AC

## 2021-10-11 NOTE — Patient Instructions (Addendum)
For vit D deficiency prescribe vit d weekly tab. Want you to follow up in 8 weeks for repeat level. Then decide what dose you will likely need on daily basis.  For diabetes eat low sugar diet, exercise and start metformin 500 mg daily.  For mild high cholesterol level recommend low cholesterol diet. Keep in mind strong recommendation for statin in diabetic. Going forward if sugar remain high and as you age will likely recommend.  Recommend since you [place] urgent care in [place] in event you need to be seen in person as virtual visits are not always practical/sufficient.   Follow up 8 weeks for vit d level. Will repeat a1c in 3 months   Follow up office visit in 3 months or as needed.

## 2021-10-11 NOTE — Progress Notes (Signed)
° °  Subjective:    Patient ID: Yvette Brennan, female    DOB: [birth year], 45 y.o.   MRN: [id number]  HPI  Virtual Visit via Video Note  I connected with Yvette Brennan on 10/11/21 at  8:00 AM EST by a video enabled telemedicine application and verified that I am speaking with the correct person using two identifiers.   Pt did not check vital.s Location: Patient: home Provider: home   I discussed the limitations of evaluation and management by telemedicine and the availability of in person appointments. The patient expressed understanding and agreed to proceed.  History of Present Illness:  Pt just moved to [place]. 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   Recent labs from gyn. Low vit d, borderline sugar level 140 average and ldl cholesterol of 107.  Vit D level is low and had that in past as well.   Pt a1c not elevated in past. Pt not on diabetic meds.  Mild elevated cholesterol. Pt not on statin.   Observations/Objective:  General-no acute distress, pleasant, oriented. Lungs- on inspection lungs appear unlabored. Neck- no tracheal deviation or jvd on inspection. Neuro- gross motor function appears intact.   Assessment and Plan:  Patient Instructions  For vit D deficiency prescribe vit d weekly tab. Want you to follow up in 8 weeks for repeat level. Then decide what dose you will likely need on daily basis.  For diabetes eat low sugar diet, exercise and start metformin 500 mg daily.  For mild high cholesterol level recommend low cholesterol diet. Keep in mind strong recommendation for statin in diabetic. Going forward if sugar remain high and as you age will likely recommend.  Recommend since you [place] urgent [place] in event you need to be seen in person as virtual visits are not always practical/sufficient.   Follow up 8 weeks for vit d level. Will repeat a1c in 3 months   Follow up office visit in 3 months or as needed.   Esperanza Richters, PA-C   Time spent with patient today was 30  minutes which consisted of chart revdiew, discussing diagnosis, work up treatment and documentation.  Follow Up Instructions:    I discussed the assessment and treatment plan with the patient. The patient was provided an opportunity to ask questions and all were answered. The patient agreed with the plan and demonstrated an understanding of the instructions.   The patient was advised to call back or seek an in-person evaluation if the symptoms worsen or if the condition fails to improve as anticipated.     Esperanza Richters, PA-C    Review of Systems  Constitutional:  Negative for chills, fatigue and fever.  HENT:  Negative for congestion, drooling, ear pain and facial swelling.   Respiratory:  Negative for cough, chest tightness, shortness of breath and wheezing.   Cardiovascular:  Negative for chest pain.  Gastrointestinal:  Negative for abdominal pain and constipation.  Genitourinary:  Negative for dysuria and pelvic pain.  Musculoskeletal:  Negative for back pain, joint swelling, myalgias and neck stiffness.  Skin:  Negative for rash.  Neurological:  Negative for dizziness, seizures and headaches.  Hematological:  Negative for adenopathy. Does not bruise/bleed easily.  Psychiatric/Behavioral:  Negative for behavioral problems and confusion.       Objective:   Physical Exam        Assessment & Plan:

## 2022-01-02 ENCOUNTER — Other Ambulatory Visit (HOSPITAL_COMMUNITY): Payer: Self-pay

## 2022-07-02 ENCOUNTER — Other Ambulatory Visit: Payer: Self-pay | Admitting: Medical

## 2022-07-05 ENCOUNTER — Telehealth: Payer: Self-pay | Admitting: Medical

## 2022-07-05 ENCOUNTER — Telehealth: Payer: Self-pay

## 2022-07-05 ENCOUNTER — Other Ambulatory Visit (HOSPITAL_BASED_OUTPATIENT_CLINIC_OR_DEPARTMENT_OTHER): Payer: Self-pay

## 2022-07-05 MED ORDER — METFORMIN HCL 500 MG PO TABS: ORAL_TABLET | ORAL | 5 refills | Status: DC

## 2022-07-05 MED ORDER — METFORMIN HCL 500 MG PO TABS
ORAL_TABLET | ORAL | 5 refills | Status: DC
  Filled 2022-07-05: qty 30, 30d supply, fill #0

## 2022-07-05 NOTE — Telephone Encounter (Signed)
Rx sent 

## 2022-07-05 NOTE — Telephone Encounter (Signed)
Patient called to get her Metformin refilled. Advised it was filled at the [facility]. Patient lives in [place] and would like it to be sent to   [facility] [address], [place], [place] [address] [phone]

## 2022-07-05 NOTE — Telephone Encounter (Signed)
Nurse Assessment Nurse: [name], RN, Regulatory affairs officer (Eastern Time): 07/04/2022 11:28:10 PM Confirm and document reason for call. If symptomatic, describe symptoms. ---Pt denies symptoms but is needing medication refill (metformin IR). pt has 5 more pills. Does the patient have any new or worsening symptoms? ---No Nurse: [name], RN, Regulatory affairs officer (Eastern Time): 07/04/2022 11:28:55 PM Please select the assessment type ---Refill Does the patient have enough medication to last until the office opens? ---Yes Disp. Time Yvette Brennan Time) Disposition Final User 07/04/2022 11:29:43 PM Clinical Call Yes [name], RN, [place] Final Disposition 07/04/2022 11:29:43 PM Clinical Call Yes [name], RN, [facility]

## 2022-08-14 LAB — HM DIABETES EYE EXAM

## 2023-01-23 ENCOUNTER — Encounter: Payer: Self-pay | Admitting: *Deleted

## 2023-01-23 ENCOUNTER — Other Ambulatory Visit: Payer: Self-pay | Admitting: Medical

## 2023-01-30 ENCOUNTER — Ambulatory Visit (INDEPENDENT_AMBULATORY_CARE_PROVIDER_SITE_OTHER): Payer: BC Managed Care – PPO | Admitting: Medical

## 2023-01-30 VITALS — BP 109/68 | HR 84 | Temp 98.2°F | Resp 18 | Ht 65.0 in | Wt 213.0 lb

## 2023-01-30 DIAGNOSIS — Z0000 Encounter for general adult medical examination without abnormal findings: Secondary | ICD-10-CM

## 2023-01-30 DIAGNOSIS — L930 Discoid lupus erythematosus: Secondary | ICD-10-CM

## 2023-01-30 DIAGNOSIS — E559 Vitamin D deficiency, unspecified: Secondary | ICD-10-CM

## 2023-01-30 DIAGNOSIS — D649 Anemia, unspecified: Secondary | ICD-10-CM | POA: Diagnosis not present

## 2023-01-30 DIAGNOSIS — R739 Hyperglycemia, unspecified: Secondary | ICD-10-CM

## 2023-01-30 LAB — LIPID PANEL
Cholesterol: 156 mg/dL (ref 0–200)
HDL: 52.4 mg/dL (ref >39.00)
LDL Cholesterol: 83 mg/dL (ref 0–99)
NonHDL: 103.74
Total CHOL/HDL Ratio: 3
Triglycerides: 104 mg/dL (ref 0.0–149.0)
VLDL: 20.8 mg/dL (ref 0.0–40.0)

## 2023-01-30 LAB — CBC WITH DIFFERENTIAL/PLATELET
Basophils Absolute: 0 10*3/uL (ref 0.0–0.1)
Basophils Relative: 0.9 % (ref 0.0–3.0)
Eosinophils Absolute: 0.1 10*3/uL (ref 0.0–0.7)
Eosinophils Relative: 1.6 % (ref 0.0–5.0)
HCT: 33.6 % — ABNORMAL LOW (ref 36.0–46.0)
Hemoglobin: 10.7 g/dL — ABNORMAL LOW (ref 12.0–15.0)
Lymphocytes Relative: 38 % (ref 12.0–46.0)
Lymphs Abs: 1.5 10*3/uL (ref 0.7–4.0)
MCHC: 31.7 g/dL (ref 30.0–36.0)
MCV: 84.1 fl (ref 78.0–100.0)
Monocytes Absolute: 0.6 10*3/uL (ref 0.1–1.0)
Monocytes Relative: 14.4 % — ABNORMAL HIGH (ref 3.0–12.0)
Neutro Abs: 1.8 10*3/uL (ref 1.4–7.7)
Neutrophils Relative %: 45.1 % (ref 43.0–77.0)
Platelets: 448 10*3/uL — ABNORMAL HIGH (ref 150.0–400.0)
RBC: 3.99 Mil/uL (ref 3.87–5.11)
RDW: 16 % — ABNORMAL HIGH (ref 11.5–15.5)
WBC: 3.9 10*3/uL — ABNORMAL LOW (ref 4.0–10.5)

## 2023-01-30 LAB — COMPREHENSIVE METABOLIC PANEL
ALT: 13 U/L (ref 0–35)
AST: 14 U/L (ref 0–37)
Albumin: 3.9 g/dL (ref 3.5–5.2)
Alkaline Phosphatase: 61 U/L (ref 39–117)
BUN: 5 mg/dL — ABNORMAL LOW (ref 6–23)
CO2: 26 mEq/L (ref 19–32)
Calcium: 8.8 mg/dL (ref 8.4–10.5)
Chloride: 104 mEq/L (ref 96–112)
Creatinine, Ser: 0.68 mg/dL (ref 0.40–1.20)
GFR: 102.87 mL/min (ref >60.00)
Glucose, Bld: 80 mg/dL (ref 70–99)
Potassium: 4.6 mEq/L (ref 3.5–5.1)
Sodium: 136 mEq/L (ref 135–145)
Total Bilirubin: 0.3 mg/dL (ref 0.2–1.2)
Total Protein: 6.9 g/dL (ref 6.0–8.3)

## 2023-01-30 LAB — HEMOGLOBIN A1C: Hgb A1c MFr Bld: 6.3 % (ref 4.6–6.5)

## 2023-01-30 LAB — VITAMIN D 25 HYDROXY (VIT D DEFICIENCY, FRACTURES): VITD: 37.62 ng/mL (ref 30.00–100.00)

## 2023-01-30 MED ORDER — METRONIDAZOLE 0.75 % EX GEL: 1.0000 | Freq: Two times a day (BID) | CUTANEOUS | 0 refills | Status: AC

## 2023-01-30 NOTE — Patient Instructions (Addendum)
For you wellness exam today I have ordered cbc, cmp and  ipid panel.  Vaccine up to date.  Recommend exercise and healthy diet.  We will let you know lab results as they come in.  Let me know if you are going to get colonosocopy in [place] or if you want me to refer to [facility].  For skin rash rx metronidazole gel. Considering possible rosacea. Will see how your respond. Update me in 2-3 weeks. Can refer to derm as well.  For vit d deficiency order vit d level.  Follow up date appointment will be determined after lab review.   Rosacea Rosacea is a long-term (chronic) condition that affects the skin of the face, including the cheeks, nose, forehead, and chin. This condition can also affect the eyes. Rosacea causes blood vessels near the surface of the skin to get bigger (be enlarged), and that makes the skin red. What are the causes? The cause of this condition is not known. Certain things can make rosacea worse, including: Exercise. Sunlight. Very hot or cold temperatures. Hot or spicy foods and drinks. Drinking alcohol. Stress. Taking blood pressure medicine. Long-term use of topical steroids on the face. What increases the risk? You are more likely to get this condition if you: Are older than 49 years of age. Are a woman. Have light-colored skin (light complexion). Have a family history of the condition. What are the signs or symptoms?  Redness of the face. Red bumps or pimples on the face. A red, enlarged nose. Blushing easily. Red lines on the skin. Eye problems such as: Irritated, burning, or itchy feeling in the eyes. Swollen eyelids. Drainage from the eyes. Feeling like there is something in your eye. How is this treated? There is no cure for this condition, but treatment can help to control your symptoms. Your doctor may suggest that you see a skin specialist (dermatologist). Treatment may include: Medicines that are put on the skin or taken by mouth  (orally). Laser treatment to improve how the skin looks. Surgery. This is rare. Your doctor will also suggest the best way to take care of your skin. Even after your skin gets better, you will likely need to continue treatment to keep your rosacea from coming back. Follow these instructions at home: Skin care Take care of your skin as told by your doctor. Your doctor may tell you to do these things: Wash your skin gently two or more times each day. Use mild soap. Use a sunscreen or sunblock with SPF 30 or greater. Use gentle cosmetics that are meant for sensitive skin. Shave with an electric shaver instead of a blade. Lifestyle Try to keep track of what foods make this condition worse. Avoid those foods. These may include: Spicy foods. Seafood. Cheese. Hot liquids. Nuts. Chocolate. Iodized salt. Do not drink alcohol. Avoid very cold or hot temperatures. Try to reduce your stress. If you need help to do this, talk with your doctor. When you exercise, do these things to stay cool: Limit sun exposure to your face. Use a fan. Exercise for a shorter time, and exercise more often. General instructions Take and apply over-the-counter and prescription medicines only as told by your doctor. If you were prescribed antibiotics, apply it or take them as told by your doctor. Do not stop using them even if your condition improves. If your eyelids are affected, hold warm compresses on them. Do this as told by your doctor. Keep all follow-up visits. Contact a doctor if:  Your symptoms get worse. Your symptoms do not improve after 2 months of treatment. You have new symptoms. You have any changes in how you see (vision) or you have problems with your eyes, such as redness or itching. You feel very sad (depressed). You do not want to eat as much as normal (lose your appetite). You have trouble focusing your mind (concentrating). Summary Rosacea is a long-term condition that affects the skin of  the face, including the cheeks, nose, forehead, and chin. Take care of your skin as told by your doctor. Take and apply medicines only as told by your doctor. Contact a doctor if your symptoms get worse or if you have problems with your eyes. Keep all follow-up visits. This information is not intended to replace advice given to you by your health care provider. Make sure you discuss any questions you have with your health care provider. Document Revised: 11/29/2021 Document Reviewed: 11/29/2021 Elsevier Patient Education  2023 [employer].

## 2023-01-30 NOTE — Progress Notes (Signed)
Subjective:    Patient ID: Yvette Brennan, female    DOB: 1976-02-22, 47 y.o.   MRN: [id number]  HPI  Pt in for cpe/wellness exam. 2 years since I have seen her.   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 Exercising occasionaly. Pt admits not best diet. Red meat very rarely.  Pt stopped smoking. Stopped May 03, 2020. No alcohol use.   Pt states she up to date on pap. States got done this year. Also pt states up to date on mammogram thru gyn.   Pt never had colonoscopy.   Also has raised red bumps to both sides of her nose/cheeks since march. Mild at first the flared down after exfoliation but never resolved.    Review of Systems  Constitutional:  Negative for chills, fatigue and fever.  HENT:  Negative for congestion and drooling.   Respiratory:  Negative for cough, chest tightness, shortness of breath and wheezing.   Cardiovascular:  Negative for chest pain and palpitations.  Gastrointestinal:  Negative for abdominal pain, blood in stool, diarrhea and nausea.  Genitourinary:  Negative for difficulty urinating and dysuria.  Musculoskeletal:  Negative for back pain, joint swelling and neck pain.  Skin:  Positive for rash.  Neurological:  Negative for seizures, facial asymmetry and numbness.  Hematological:  Negative for adenopathy. Does not bruise/bleed easily.  Psychiatric/Behavioral:  Negative for behavioral problems, decreased concentration and sleep disturbance. The patient is not nervous/anxious.     Past Medical History:  Diagnosis Date   Allergy    Anxiety    GERD (gastroesophageal reflux disease)    almost every other day.     Social History   Socioeconomic History   Marital status: Single    Spouse name: Not on file   Number of children: Not on file   Years of education: Not on file   Highest education level: Not on file  Occupational History   Not on file  Tobacco Use   Smoking status: Former    Packs/day: 0.10    Years: 12.00     Additional pack years: 0.00    Total pack years: 1.20    Types: Cigars, Cigarettes    Quit date: 05/03/2020    Years since quitting: 2.7   Smokeless tobacco: Never  Substance and Sexual Activity   Alcohol use: No   Drug use: No   Sexual activity: Never    Birth control/protection: Abstinence  Other Topics Concern   Not on file  Social History Narrative   Exercise--no   Social Determinants of Health   Financial Resource Strain: Not on file  Food Insecurity: Not on file  Transportation Needs: Not on file  Physical Activity: Not on file  Stress: Not on file  Social Connections: Not on file  Intimate Partner Violence: Not on file    No past surgical history on file.  Family History  Problem Relation Age of Onset   Diabetes Father    Heart disease Maternal Grandmother    Heart disease Maternal Grandfather    Diabetes Paternal Grandmother    Heart disease Paternal Grandmother        MI    No Known Allergies  Current Outpatient Medications on File Prior to Visit  Medication Sig Dispense Refill   metFORMIN (GLUCOPHAGE) 500 MG tablet TAKE 1 TABLET BY MOUTH EVERY DAY WITH BREAKFAST 30 tablet 5   Vitamin D, Ergocalciferol, (DRISDOL) 1.25 MG (50000 UNIT) CAPS capsule Take 1 capsule (50,000 Units total)  by mouth every 7 (seven) days. 8 capsule 0   No current facility-administered medications on file prior to visit.    BP 109/68   Pulse 84   Temp 98.2 F (36.8 C)   Resp 18   Ht 5\' 5"  (1.651 m)   Wt 213 lb (96.6 kg)   LMP 01/22/2023   SpO2 99%   BMI 35.45 kg/m        Objective:   Physical Exam  General Mental Status- Alert. General Appearance- Not in acute distress.   Skin Rash on both sides of nose cheek. Left side 3-4 small comedone appearance. Rt side 1-2.  Neck Carotid Arteries- Normal color. Moisture- Normal Moisture. No carotid bruits. No JVD.  Chest and Lung Exam Auscultation: Breath Sounds:-Normal.  Cardiovascular Auscultation:Rythm-  Regular. Murmurs & Other Heart Sounds:Auscultation of the heart reveals- No Murmurs.  Abdomen Inspection:-Inspeection Normal. Palpation/Percussion:Note:No mass. Palpation and Percussion of the abdomen reveal- Non Tender, Non Distended + BS, no rebound or guarding.   Neurologic Cranial Nerve exam:- CN III-XII intact(No nystagmus), symmetric smile. Strength:- 5/5 equal and symmetric strength both upper and lower extremities.       Assessment & Plan:  For you wellness exam today I have ordered cbc, cmp and  ipid panel.  Vaccine up to date.  Recommend exercise and healthy diet.  We will let you know lab results as they come in.  Let me know if you are going to get colonsocopy in [place] or if you want me to refer to [facility].  For skin rash rx metronidazole gel. Considering possible rosacea. Will see how your respond. Update me in 2-3 weeks. Can refer to derm as well.  For vit d deficiency order vit d level.  Follow up date appointment will be determined after lab review.   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

## 2023-01-31 NOTE — Addendum Note (Signed)
Addended by: Gwenevere Abbot on: 01/31/2023 06:52 AM   Modules accepted: Orders

## 2023-02-01 ENCOUNTER — Other Ambulatory Visit: Payer: Self-pay

## 2023-02-01 LAB — ANTI-NUCLEAR AB-TITER (ANA TITER): ANA Titer 1: 1:80 {titer} — ABNORMAL HIGH

## 2023-02-01 LAB — ANA: Anti Nuclear Antibody (ANA): POSITIVE — AB

## 2023-02-01 MED ORDER — METFORMIN HCL 500 MG PO TABS: ORAL_TABLET | ORAL | 5 refills | Status: DC

## 2023-08-15 ENCOUNTER — Other Ambulatory Visit: Payer: Self-pay | Admitting: Medical

## 2023-09-12 ENCOUNTER — Other Ambulatory Visit: Payer: Self-pay | Admitting: Medical

## 2023-10-22 ENCOUNTER — Other Ambulatory Visit: Payer: Self-pay | Admitting: Medical

## 2023-11-17 ENCOUNTER — Other Ambulatory Visit: Payer: Self-pay | Admitting: Medical

## 2023-12-09 ENCOUNTER — Other Ambulatory Visit: Payer: Self-pay | Admitting: Medical

## 2024-01-05 ENCOUNTER — Other Ambulatory Visit: Payer: Self-pay | Admitting: Medical

## 2024-01-26 ENCOUNTER — Other Ambulatory Visit: Payer: Self-pay | Admitting: Medical

## 2024-02-03 ENCOUNTER — Ambulatory Visit (INDEPENDENT_AMBULATORY_CARE_PROVIDER_SITE_OTHER): Payer: Self-pay | Admitting: Medical

## 2024-02-03 VITALS — BP 120/78 | HR 67 | Temp 98.2°F | Resp 18 | Ht 65.0 in | Wt 214.0 lb

## 2024-02-03 DIAGNOSIS — L659 Nonscarring hair loss, unspecified: Secondary | ICD-10-CM | POA: Diagnosis not present

## 2024-02-03 DIAGNOSIS — E559 Vitamin D deficiency, unspecified: Secondary | ICD-10-CM

## 2024-02-03 DIAGNOSIS — R5383 Other fatigue: Secondary | ICD-10-CM | POA: Diagnosis not present

## 2024-02-03 DIAGNOSIS — R739 Hyperglycemia, unspecified: Secondary | ICD-10-CM | POA: Diagnosis not present

## 2024-02-03 LAB — CBC WITH DIFFERENTIAL/PLATELET
Basophils Absolute: 0 10*3/uL (ref 0.0–0.1)
Basophils Relative: 0.5 % (ref 0.0–3.0)
Eosinophils Absolute: 0.1 10*3/uL (ref 0.0–0.7)
Eosinophils Relative: 2.1 % (ref 0.0–5.0)
HCT: 36.7 % (ref 36.0–46.0)
Hemoglobin: 11.9 g/dL — ABNORMAL LOW (ref 12.0–15.0)
Lymphocytes Relative: 39.4 % (ref 12.0–46.0)
Lymphs Abs: 1.3 10*3/uL (ref 0.7–4.0)
MCHC: 32.3 g/dL (ref 30.0–36.0)
MCV: 89.1 fl (ref 78.0–100.0)
Monocytes Absolute: 0.5 10*3/uL (ref 0.1–1.0)
Monocytes Relative: 14.4 % — ABNORMAL HIGH (ref 3.0–12.0)
Neutro Abs: 1.4 10*3/uL (ref 1.4–7.7)
Neutrophils Relative %: 43.6 % (ref 43.0–77.0)
Platelets: 354 10*3/uL (ref 150.0–400.0)
RBC: 4.12 Mil/uL (ref 3.87–5.11)
RDW: 16.3 % — ABNORMAL HIGH (ref 11.5–15.5)
WBC: 3.3 10*3/uL — ABNORMAL LOW (ref 4.0–10.5)

## 2024-02-03 LAB — COMPREHENSIVE METABOLIC PANEL WITH GFR
ALT: 14 U/L (ref 0–35)
AST: 14 U/L (ref 0–37)
Albumin: 4.1 g/dL (ref 3.5–5.2)
Alkaline Phosphatase: 69 U/L (ref 39–117)
BUN: 7 mg/dL (ref 6–23)
CO2: 25 meq/L (ref 19–32)
Calcium: 9.1 mg/dL (ref 8.4–10.5)
Chloride: 107 meq/L (ref 96–112)
Creatinine, Ser: 0.71 mg/dL (ref 0.40–1.20)
GFR: 99.72 mL/min (ref >60.00)
Glucose, Bld: 114 mg/dL — ABNORMAL HIGH (ref 70–99)
Potassium: 4.2 meq/L (ref 3.5–5.1)
Sodium: 139 meq/L (ref 135–145)
Total Bilirubin: 0.4 mg/dL (ref 0.2–1.2)
Total Protein: 6.9 g/dL (ref 6.0–8.3)

## 2024-02-03 LAB — HEMOGLOBIN A1C: Hgb A1c MFr Bld: 6.4 % (ref 4.6–6.5)

## 2024-02-03 LAB — TSH: TSH: 0.54 u[IU]/mL (ref 0.35–5.50)

## 2024-02-03 LAB — VITAMIN B12: Vitamin B-12: 174 pg/mL — ABNORMAL LOW (ref 211–911)

## 2024-02-03 LAB — VITAMIN D 25 HYDROXY (VIT D DEFICIENCY, FRACTURES): VITD: 23.37 ng/mL — ABNORMAL LOW (ref 30.00–100.00)

## 2024-02-03 LAB — T4, FREE: Free T4: 0.95 ng/dL (ref 0.60–1.60)

## 2024-02-03 NOTE — Progress Notes (Signed)
 Subjective:    Patient ID: Yvette Brennan, female    DOB: 28-Mar-1976, 48 y.o.   MRN: [id number]  HPI  Discussed the use of AI scribe software for clinical note transcription with the patient, who gave verbal consent to proceed.  History of Present Illness   Yvette Brennan is a 48 year old female who presents with hair loss and fatigue.  She has been experiencing significant hair loss since the end of January 2025. Initially, clumps of hair were coming out while washing, and there was an unusual amount of shedding when removing hair from protective styles like braids. Last Wednesday, she noticed a whole section of hair had fallen out, with the remaining hair breaking off. She is concerned about potential internal causes for this hair loss. There is no family history of hair loss among her sisters or mother.  She reports experiencing fatigue that has been off and on but has increased lately. Her energy levels have been low, and she has been feeling more fatigued recently. During the review of symptoms, she confirmed experiencing fatigue and normal level stres(not above her baseline average stress)  She has a history of vitamin D deficiency and takes over-the-counter vitamin D,, and B12 inconsistently.  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           Lmp- last month.(No premeonpausal pattern)  Review of Systems  Constitutional:  Negative for chills, diaphoresis, fatigue and fever.  Respiratory:  Negative for cough, chest tightness, shortness of breath and wheezing.   Cardiovascular:  Negative for chest pain and palpitations.  Gastrointestinal:  Negative for abdominal pain.  Genitourinary:  Negative  for dysuria.  Musculoskeletal:  Negative for back pain.  Neurological:  Negative for dizziness, speech difficulty, weakness and light-headedness.  Hematological:  Negative for adenopathy. Does not bruise/bleed easily.  Psychiatric/Behavioral:  Negative for behavioral problems, decreased concentration, sleep disturbance and suicidal ideas. The patient is nervous/anxious.        Normal variant stress not above her average level of stress that she has had in past.     Past Medical History:  Diagnosis Date   Allergy    Anxiety    GERD (gastroesophageal reflux disease)    almost every other day.     Social History   Socioeconomic History   Marital status: Single    Spouse name: Not on file   Number of children: Not on file   Years of education: Not on file   Highest education level: Not on file  Occupational History   Not on file  Tobacco Use   Smoking status: Former    Current packs/day: 0.00    Average packs/day: 0.1 packs/day for 12.0 years (1.2 ttl pk-yrs)    Types: Cigars, Cigarettes    Start date: 05/03/2008    Quit date: 05/03/2020    Years since quitting: 3.7   Smokeless tobacco: Never  Substance and Sexual Activity   Alcohol use: No   Drug use: No   Sexual activity: Never    Birth control/protection: Abstinence  Other Topics Concern   Not on file  Social History Narrative   Exercise--no   Social Drivers of Dispensing optician  Resource Strain: Not on file  Food Insecurity: Not on file  Transportation Needs: Not on file  Physical Activity: Not on file  Stress: Not on file  Social Connections: Not on file  Intimate Partner Violence: Not on file    No past surgical history on file.  Family History  Problem Relation Age of Onset   Diabetes Father    Heart disease Maternal Grandmother    Heart disease Maternal Grandfather    Diabetes Paternal Grandmother    Heart disease Paternal Grandmother        MI    No Known Allergies  Current Outpatient  Medications on File Prior to Visit  Medication Sig Dispense Refill   metFORMIN (GLUCOPHAGE) 500 MG tablet TAKE 1 TABLET BY MOUTH EVERY DAY WITH BREAKFAST 30 tablet 0   metroNIDAZOLE (METROGEL) 0.75 % gel Apply 1 Application topically 2 (two) times daily. 45 g 0   Vitamin D, Ergocalciferol, (DRISDOL) 1.25 MG (50000 UNIT) CAPS capsule Take 1 capsule (50,000 Units total) by mouth every 7 (seven) days. 8 capsule 0   No current facility-administered medications on file prior to visit.    BP 120/78   Pulse 67   Temp 98.2 F (36.8 C)   Resp 18   Ht 5\' 5"  (1.651 m)   Wt 214 lb (97.1 kg)   SpO2 100%   BMI 35.61 kg/m        Objective:   Physical Exam  General Mental Status- Alert. General Appearance- Not in acute distress.   Skin Moderate to severe hair loss by review of pictures. Hairs in brades with extensions.   Neck Carotid Arteries- Normal color. Moisture- Normal Moisture. No carotid bruits. No JVD.  Chest and Lung Exam Auscultation: Breath Sounds:-Normal.  Cardiovascular Auscultation:Rythm- Regular. Murmurs & Other Heart Sounds:Auscultation of the heart reveals- No Murmurs.  Neurologic Cn III-XII grossly intact.        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 Considered thyroid dysfunction and nutritional deficiencies. - Order thyroid studies: TSH, T4. - Order CBC, B12, vitamin D levels. - Recommend OTC minoxidil foam. - Refer to dermatologist specializing in hair loss.  Fatigue Increased fatigue possibly related to thyroid dysfunction, vitamin deficiencies, or other conditions. - Order thyroid studies: TSH, T4. - Order CBC, B12, vitamin D levels. - Order A1c for diabetes assessment.  Vitamin D Deficiency Vitamin D deficiency with inconsistent supplementation. - Order vitamin D level.  General Health Maintenance Discussed establishing care with a local primary care provider and  colonoscopy - Coordinate records transfer to new primary care provider. - Schedule colonoscopy.  Follow-up Plans for follow-up on lab results and management based on findings. - Follow up on lab results and update her. -consider referal to dermatologist that specializes in hair loss. Particularly if thyroid labs normal and if no response to minoxidil.        Lilyahna Sirmon, PA-C

## 2024-02-03 NOTE — Patient Instructions (Signed)
 Hair Loss Significant hair loss with possible traction alopecia. Considered thyroid dysfunction and nutritional deficiencies. - Order thyroid studies: TSH, T4. - Order CBC, B12, vitamin D levels. - Recommend OTC minoxidil foam. - Refer to dermatologist specializing in hair loss.  Fatigue Increased fatigue possibly related to thyroid dysfunction, vitamin deficiencies, or other conditions. - Order thyroid studies: TSH, T4. - Order CBC, B12, vitamin D levels. - Order A1c for diabetes assessment.  Vitamin D Deficiency Vitamin D deficiency with inconsistent supplementation. - Order vitamin D level.  General Health Maintenance Discussed establishing care with a local primary care provider and colonoscopy - Coordinate records transfer to new primary care provider. - Schedule colonoscopy.  Follow-up Plans for follow-up on lab results and management based on findings. - Follow up on lab results and update her. -consider referal to dermatologist that specializes in hair loss. Particularly if thyroid labs normal and if no response to minoxidil.

## 2024-02-04 ENCOUNTER — Encounter: Payer: Self-pay | Admitting: Medical

## 2024-02-05 ENCOUNTER — Telehealth: Payer: Self-pay | Admitting: Medical

## 2024-02-05 MED ORDER — VITAMIN D (ERGOCALCIFEROL) 1.25 MG (50000 UNIT) PO CAPS: 50000.0000 [IU] | ORAL_CAPSULE | ORAL | 0 refills | Status: AC

## 2024-02-05 NOTE — Addendum Note (Signed)
 Addended by: Serafina Damme on: 02/05/2024 12:06 PM   Modules accepted: Orders

## 2024-02-05 NOTE — Telephone Encounter (Signed)
 Copied from CRM [id number]. Topic: General - Other >> Feb 05, 2024 11:08 AM Yvette Brennan wrote: Reason for CRM: Patient calling in because her medication has not been sent in yet. Patient would like to know when will it be sent in to her pharmacy   Vitamin D, Ergocalciferol, (DRISDOL) 1.25 MG (50000 UNIT) CAPS capsule

## 2024-02-22 ENCOUNTER — Other Ambulatory Visit: Payer: Self-pay | Admitting: Medical

## 2024-03-22 ENCOUNTER — Other Ambulatory Visit: Payer: Self-pay | Admitting: Medical

## 2024-04-28 ENCOUNTER — Other Ambulatory Visit: Payer: Self-pay

## 2024-04-28 ENCOUNTER — Other Ambulatory Visit: Payer: Self-pay | Admitting: Medical

## 2024-05-29 ENCOUNTER — Other Ambulatory Visit: Payer: Self-pay | Admitting: Medical

## 2024-07-07 ENCOUNTER — Other Ambulatory Visit: Payer: Self-pay | Admitting: Medical

## 2024-08-04 ENCOUNTER — Other Ambulatory Visit: Payer: Self-pay | Admitting: Medical

## 2024-08-31 ENCOUNTER — Telehealth: Payer: Self-pay

## 2024-08-31 NOTE — Telephone Encounter (Signed)
 Called pt to see if she wanted to schedule an appointment she agreed that she needed something for a cold and a work note  Looked at [facility] schedule but he had nothing this week. I advised her that she could see a different provider but she declined. Pt states that she will go to urgent care instead

## 2024-08-31 NOTE — Telephone Encounter (Signed)
 error

## 2024-09-30 NOTE — Telephone Encounter (Signed)
 Pt is scheduled to see Dr. Evelina on 10/06/24, but he wanted a CTA chest prior to seeing pt. The hospital entered the order, but I do not see it active in the chart. I have entered it today. Can someone call him to schedule please? If he's unable to have it prior we'll need to reschedule the appt on 12/16. Thank you.
